# Patient Record
Sex: Male | Born: 1935 | Race: White | Hispanic: No | Marital: Married | Smoking: Never smoker
Health system: Southern US, Community
[De-identification: ages and names within clinical notes are randomized; demographics above are authoritative.]

## PROBLEM LIST (undated history)

## (undated) DIAGNOSIS — N400 Enlarged prostate without lower urinary tract symptoms: Secondary | ICD-10-CM

## (undated) DIAGNOSIS — K254 Chronic or unspecified gastric ulcer with hemorrhage: Secondary | ICD-10-CM

## (undated) HISTORY — PX: APPENDECTOMY: SHX54

---

## 1998-07-14 ENCOUNTER — Ambulatory Visit (HOSPITAL_COMMUNITY): Admission: RE | Admit: 1998-07-14 | Discharge: 1998-07-14 | Payer: Self-pay | Admitting: Gastroenterology

## 2000-05-30 ENCOUNTER — Encounter (INDEPENDENT_AMBULATORY_CARE_PROVIDER_SITE_OTHER): Payer: Self-pay | Admitting: Specialist

## 2000-05-30 ENCOUNTER — Ambulatory Visit (HOSPITAL_COMMUNITY): Admission: RE | Admit: 2000-05-30 | Discharge: 2000-05-30 | Payer: Self-pay | Admitting: Gastroenterology

## 2002-06-01 ENCOUNTER — Ambulatory Visit (HOSPITAL_COMMUNITY): Admission: RE | Admit: 2002-06-01 | Discharge: 2002-06-01 | Payer: Self-pay | Admitting: Gastroenterology

## 2002-06-01 ENCOUNTER — Encounter (INDEPENDENT_AMBULATORY_CARE_PROVIDER_SITE_OTHER): Payer: Self-pay

## 2004-07-16 ENCOUNTER — Ambulatory Visit (HOSPITAL_COMMUNITY): Admission: RE | Admit: 2004-07-16 | Discharge: 2004-07-16 | Payer: Self-pay | Admitting: Gastroenterology

## 2004-07-16 ENCOUNTER — Encounter (INDEPENDENT_AMBULATORY_CARE_PROVIDER_SITE_OTHER): Payer: Self-pay | Admitting: *Deleted

## 2009-01-18 ENCOUNTER — Encounter: Admission: RE | Admit: 2009-01-18 | Discharge: 2009-01-18 | Payer: Self-pay | Admitting: Neurology

## 2010-06-06 ENCOUNTER — Ambulatory Visit (HOSPITAL_COMMUNITY): Admission: AD | Admit: 2010-06-06 | Discharge: 2010-06-06 | Payer: Self-pay | Admitting: Ophthalmology

## 2010-10-09 LAB — BASIC METABOLIC PANEL
CO2: 23 mEq/L (ref 19–32)
Calcium: 9.7 mg/dL (ref 8.4–10.5)
Chloride: 111 mEq/L (ref 96–112)
GFR calc Af Amer: >60 mL/min (ref >60)
Potassium: 4.2 mEq/L (ref 3.5–5.1)
Sodium: 140 mEq/L (ref 135–145)

## 2010-10-09 LAB — CBC
Hemoglobin: 15.3 g/dL (ref 13.0–17.0)
MCH: 32.1 pg (ref 26.0–34.0)
MCV: 92.6 fL (ref 78.0–100.0)
RBC: 4.76 MIL/uL (ref 4.22–5.81)

## 2010-10-09 LAB — SURGICAL PCR SCREEN: Staphylococcus aureus: NEGATIVE

## 2010-12-14 NOTE — Procedures (Signed)
[facility]  Patient:    Jeffery Alexander, Jeffery Alexander                        MRN: [id number] Proc. Date: 05/30/00 Adm. Date:  [id number] Attending:  Nelda Marseille                           Procedure Report  PROCEDURE:  Esophagogastroduodenoscopy with biopsy.  INDICATION:  Barretts screening.  Consent was signed after risks, benefits, methods, and options thoroughly discussed multiple times in the past and before any premedications given today.  Additional medicines for this procedure since it followed colonoscopy was 2 mg of Versed.  DESCRIPTION OF PROCEDURE:  The video endoscope was inserted by direct vision. The proximal esophagus was normal.  In the midesophagus was an obvious area of Barretts, which was extensively biopsied at the end of the procedure.  The scope was inserted into a moderate to large hiatal hernia pouch and advanced into the stomach into the antrum, where his old antral scar was seen unchanged.  The scope was inserted through a normal pylorus into a normal duodenal bulb and around the C-loop to a normal second portion of the duodenum.  The scope was withdrawn back to the bulb, and a good look there rule out any ulcer or _____.  The scope was withdrawn back to the stomach and retroflexed.  Angularis, cardia, fundus, lesser and greater curve were evaluated on retroflex and then straight visualization.  The hiatal hernia was confirmed in the cardia, and there was some gastritis seen in the antrum and distal stomach but no other abnormalities.  The scope was then slowly withdrawn back to 20 cm.  No additional findings were seen.  Biopsies of the Barretts area were obtained at this juncture.  Air was suctioned and the scope removed.  The patient tolerated the procedure well.  There was no obvious immediate complications.  ENDOSCOPIC DIAGNOSES: 1. Moderate to large hiatal hernia. 2. Obvious Barretts status post biopsy. 3. Mild gastritis. 4. Old  antral scar, unchanged. 5. Otherwise within normal limits EGD.  PLAN;  Await pathology but probably recheck in two years unless dysplasia. Will stop alternating Prilosec and Zantac and just go to Prilosec, since he has noticed a difference recently.  Call me p.r.n. and otherwise follow up in six months. DD:  05/30/00 TD:  05/30/00 Job: [id number]

## 2010-12-14 NOTE — Op Note (Signed)
NAME:  Jeffery Alexander, Jeffery Alexander               ACCOUNT NO.:  [id number]   MEDICAL RECORD NO.:  [id number]          PATIENT TYPE:  AMB   LOCATION:  ENDO                         FACILITY:  [facility]   PHYSICIAN:  Petra Kuba, M.D.    DATE OF BIRTH:  1927/05/12   DATE OF PROCEDURE:  07/16/2004  DATE OF DISCHARGE:                                 OPERATIVE REPORT   PROCEDURE PERFORMED:  Esophagogastroduodenoscopy with biopsy.   ENDOSCOPIST:  Petra Kuba, M.D.   INDICATIONS FOR PROCEDURE:  Patient with history of Barretts due for repeat  screening.  Consent was signed prior to any premeds given, after the risks,  benefits, methods and options were thoroughly discussed multiple times in  the past.   MEDICINES USED:  Demerol 60 mg, Versed 6 mg.   DESCRIPTION OF PROCEDURE:  The video endoscope was inserted by direct  vision.  Proximally the esophagus was normal.  In the distal esophagus was a  small hiatal hernia with the obvious changes of Barretts short segment only  unchanged.  No mass lesions were seen.  Biopsies were obtained in the  customary fashion at the end of the procedure.  Scope passed into the  stomach and advanced to the antrum.  The antral scar previously seen was  seen and unchanged.  The pylorus was normal.  The scope passed into a normal  duodenal bulb and around the C-loop to a normal second portion of the  duodenum.  The scope was withdrawn back to the bulb, which was normal.  The  scope was withdrawn back to the stomach and retroflexed.  The angularis,  cardia and fundus were normal except for the hiatal hernia being confirmed  in the cardia.  There was some minimal gastritis seen on retroflexion and  straight visualization but no additional findings were seen on good  evaluation of the lesser and greater curve.  The scope was straightened and  withdrawn back to about 20 cm which confirmed the above mentioned esophageal  findings.  No other abnormalities were seen.  The biopsies  for Barretts were  obtained at this juncture.  Air was suctioned, scope was removed.  The  patient tolerated the procedure well.  There were no obvious immediate  complication.   ENDOSCOPIC DIAGNOSIS:  1.  Small hiatal hernia.  2.  Short segment Barretts unchanged, status post biopsy.  3.  Minimal gastritis.  4.  Antral ulcer scar unchanged.  5.  Otherwise normal esophagogastroduodenoscopy.   PLAN:  Await pathology, continue pump inhibitors, follow-up with me as  needed or when due for colonic screening or repeat Barretts screening.     MEM/MEDQ  D:  07/16/2004  T:  07/17/2004  Job:  [id number]

## 2010-12-14 NOTE — Procedures (Signed)
[facility]  Patient:    Jeffery Alexander, Jeffery Alexander                        MRN: [id number] Proc. Date: 05/30/00 Adm. Date:  [id number] Attending:  Nelda Marseille                           Procedure Report  PROCEDURE:  Colonoscopy.  SURGEON:  Petra Kuba, M.D.  INDICATIONS:  History of colon polyps and due for repeat screening.  Consent was signed after risks, benefits, methods, and options were thoroughly discussed multiple times in the past.  MEDICINES USED:  Demerol 80 and Versed 7.5.  DESCRIPTION OF PROCEDURE:  Rectal inspection was pertinent for small external hemorrhoids.  Digital exam is negative.  The scope was inserted fairly easily and advanced around the colon to the cecum.  This did require some abdominal pressure and no position changes.  On insertion, no abnormalities were seen. The cecum was identified by the appendiceal orifice and the ileocecal valve. The scope was slowly withdrawn.  The prep was adequate.  There was some liquid stool that required washing and suctioning.  On slow withdrawal through the colon, the cecum, ascending, and transverse were normal.  In the mid descending, a questionable tiny 1-2 mm polyp was seen and was cold biopsied x 1.  The scope was further withdrawn.  No other abnormalities were seen as we withdrew back to the rectum.  Once back in the rectum, prep-induced ______ were seen and photo documentation was obtained.  They were also seen on his last colonoscopy, not biopsied this time.  The scope was retroflexed and pertinent for some internal hemorrhoids.  The scope was straightened and readvanced a short ways up the sigmoid.  Air was suctioned and the scope removed.  The patient tolerated the procedure well and there was no obvious or immediate complication.  ENDOSCOPIC DIAGNOSES: 1. Internal and external hemorrhoids. 2. Tiny rectal erosions, probably prep-induced. 3. Questionable descending polyp, status  post cold biopsy. 4. Otherwise within normal limits to the cecum.  PLAN:  Continue workup with EGD for Barretts screening.  Await pathology, but probably would recheck colon screening in five years. DD:  05/30/00 TD:  05/30/00 Job: [id number]

## 2010-12-14 NOTE — Op Note (Signed)
   NAME:  Jeffery Alexander, Jeffery Alexander                           ACCOUNT NO.:  [id number]   MEDICAL RECORD NO.:  [id number]                   PATIENT TYPE:  AMB   LOCATION:  ENDO                                 FACILITY:  [facility]   PHYSICIAN:  Petra Kuba, M.D.                 DATE OF BIRTH:  01/06/1927   DATE OF PROCEDURE:  DATE OF DISCHARGE:                                 OPERATIVE REPORT   PROCEDURE:  Esophagogastroduodenoscopy with biopsy.   INDICATIONS FOR PROCEDURE:  Barrett's due for repeat screening. Consent was  signed after risks, benefits, methods, and options were thoroughly discussed  multiple times in the past.   MEDICINES USED:  Demerol 60, Versed 7.   DESCRIPTION OF PROCEDURE:  The video endoscope was inserted by direct  vision. The proximal mid esophagus was normal. In the distal esophagus was  the customary short segment Barrett's that looked unchanged without any mass  lesion. There was a moderate hiatal hernia. The scope passed into the  stomach and into the antrum where his old anterior scar was seen and  unchanged. There was a moderate amount of antritis. The scope was inserted  through a widely patent pylorus into a normal duodenal bulb and around the C  loop to a normal second portion of the duodenum. The scope was withdrawn  back the bulb and a good look there ruled out abnormalities in that  location. The scope was withdrawn back to the stomach and retroflexed. The  angularis, cardia, fundus, lesser and greater curve were normal except for  the hiatal hernia being confirmed in the cardia. Straight visualization in  the stomach confirmed the above finding. We went ahead and slowly withdrew  back to 20 cm. No additional esophageal findings were seen. We then first  obtained biopsies of the Barrett's and put that in container #1. We then  went ahead and took a few biopsies of the antritis and put that in container  #2. Air was suctioned, scope slowly withdrawn. Again on  slow withdrawal  through the esophagus, no additional findings were seen. The scope was  removed, the patient tolerated the procedure well. There was no obvious or  immediate complication.   ENDOSCOPIC DIAGNOSIS:  1. Moderate hiatal hernia.  2. Short segment Barrett's status post biopsy.  3. Old antral scar unchanged.  4. Antritis moderate status post biopsy.  5. Otherwise normal EGD.   PLAN:  Await pathology, continue Prilosec. Follow-up p.r.n.                                               Petra Kuba, M.D.    MEM/MEDQ  D:  06/01/2002  T:  06/01/2002  Job:  [id number]   cc:   Valetta Fuller, M.D.

## 2011-08-09 DIAGNOSIS — H11439 Conjunctival hyperemia, unspecified eye: Secondary | ICD-10-CM | POA: Diagnosis not present

## 2011-08-09 DIAGNOSIS — H4011X0 Primary open-angle glaucoma, stage unspecified: Secondary | ICD-10-CM | POA: Diagnosis not present

## 2011-08-09 DIAGNOSIS — H04129 Dry eye syndrome of unspecified lacrimal gland: Secondary | ICD-10-CM | POA: Diagnosis not present

## 2011-08-09 DIAGNOSIS — H16229 Keratoconjunctivitis sicca, not specified as Sjogren's, unspecified eye: Secondary | ICD-10-CM | POA: Diagnosis not present

## 2011-08-15 DIAGNOSIS — N401 Enlarged prostate with lower urinary tract symptoms: Secondary | ICD-10-CM | POA: Diagnosis not present

## 2011-10-23 DIAGNOSIS — H4011X0 Primary open-angle glaucoma, stage unspecified: Secondary | ICD-10-CM | POA: Diagnosis not present

## 2011-10-23 DIAGNOSIS — Z961 Presence of intraocular lens: Secondary | ICD-10-CM | POA: Diagnosis not present

## 2011-10-23 DIAGNOSIS — H409 Unspecified glaucoma: Secondary | ICD-10-CM | POA: Diagnosis not present

## 2011-10-23 DIAGNOSIS — H35359 Cystoid macular degeneration, unspecified eye: Secondary | ICD-10-CM | POA: Diagnosis not present

## 2011-10-30 DIAGNOSIS — H59029 Cataract (lens) fragments in eye following cataract surgery, unspecified eye: Secondary | ICD-10-CM | POA: Diagnosis not present

## 2011-10-30 DIAGNOSIS — H3560 Retinal hemorrhage, unspecified eye: Secondary | ICD-10-CM | POA: Diagnosis not present

## 2011-10-30 DIAGNOSIS — H35359 Cystoid macular degeneration, unspecified eye: Secondary | ICD-10-CM | POA: Diagnosis not present

## 2011-10-30 DIAGNOSIS — T8529XA Other mechanical complication of intraocular lens, initial encounter: Secondary | ICD-10-CM | POA: Diagnosis not present

## 2011-10-30 DIAGNOSIS — H35049 Retinal micro-aneurysms, unspecified, unspecified eye: Secondary | ICD-10-CM | POA: Diagnosis not present

## 2011-11-06 DIAGNOSIS — H35359 Cystoid macular degeneration, unspecified eye: Secondary | ICD-10-CM | POA: Diagnosis not present

## 2011-12-18 DIAGNOSIS — H35359 Cystoid macular degeneration, unspecified eye: Secondary | ICD-10-CM | POA: Diagnosis not present

## 2012-01-20 DIAGNOSIS — H35369 Drusen (degenerative) of macula, unspecified eye: Secondary | ICD-10-CM | POA: Diagnosis not present

## 2012-01-20 DIAGNOSIS — H4011X0 Primary open-angle glaucoma, stage unspecified: Secondary | ICD-10-CM | POA: Diagnosis not present

## 2012-01-20 DIAGNOSIS — H35359 Cystoid macular degeneration, unspecified eye: Secondary | ICD-10-CM | POA: Diagnosis not present

## 2012-01-20 DIAGNOSIS — H409 Unspecified glaucoma: Secondary | ICD-10-CM | POA: Diagnosis not present

## 2012-01-28 DIAGNOSIS — H35359 Cystoid macular degeneration, unspecified eye: Secondary | ICD-10-CM | POA: Diagnosis not present

## 2012-02-24 DIAGNOSIS — H40019 Open angle with borderline findings, low risk, unspecified eye: Secondary | ICD-10-CM | POA: Diagnosis not present

## 2012-02-24 DIAGNOSIS — H04129 Dry eye syndrome of unspecified lacrimal gland: Secondary | ICD-10-CM | POA: Diagnosis not present

## 2012-02-24 DIAGNOSIS — H4011X0 Primary open-angle glaucoma, stage unspecified: Secondary | ICD-10-CM | POA: Diagnosis not present

## 2012-03-27 DIAGNOSIS — H35049 Retinal micro-aneurysms, unspecified, unspecified eye: Secondary | ICD-10-CM | POA: Diagnosis not present

## 2012-03-27 DIAGNOSIS — H59029 Cataract (lens) fragments in eye following cataract surgery, unspecified eye: Secondary | ICD-10-CM | POA: Diagnosis not present

## 2012-03-27 DIAGNOSIS — H35359 Cystoid macular degeneration, unspecified eye: Secondary | ICD-10-CM | POA: Diagnosis not present

## 2012-05-07 DIAGNOSIS — Z23 Encounter for immunization: Secondary | ICD-10-CM | POA: Diagnosis not present

## 2012-06-01 DIAGNOSIS — Z888 Allergy status to other drugs, medicaments and biological substances status: Secondary | ICD-10-CM | POA: Diagnosis not present

## 2012-06-01 DIAGNOSIS — H409 Unspecified glaucoma: Secondary | ICD-10-CM | POA: Diagnosis not present

## 2012-06-01 DIAGNOSIS — H4011X0 Primary open-angle glaucoma, stage unspecified: Secondary | ICD-10-CM | POA: Diagnosis not present

## 2012-07-08 DIAGNOSIS — H4011X0 Primary open-angle glaucoma, stage unspecified: Secondary | ICD-10-CM | POA: Diagnosis not present

## 2012-07-08 DIAGNOSIS — H409 Unspecified glaucoma: Secondary | ICD-10-CM | POA: Diagnosis not present

## 2012-08-12 DIAGNOSIS — H409 Unspecified glaucoma: Secondary | ICD-10-CM | POA: Diagnosis not present

## 2012-08-12 DIAGNOSIS — H04129 Dry eye syndrome of unspecified lacrimal gland: Secondary | ICD-10-CM | POA: Diagnosis not present

## 2012-08-12 DIAGNOSIS — H4011X0 Primary open-angle glaucoma, stage unspecified: Secondary | ICD-10-CM | POA: Diagnosis not present

## 2012-08-26 DIAGNOSIS — H40019 Open angle with borderline findings, low risk, unspecified eye: Secondary | ICD-10-CM | POA: Diagnosis not present

## 2012-08-26 DIAGNOSIS — H04129 Dry eye syndrome of unspecified lacrimal gland: Secondary | ICD-10-CM | POA: Diagnosis not present

## 2012-08-26 DIAGNOSIS — H409 Unspecified glaucoma: Secondary | ICD-10-CM | POA: Diagnosis not present

## 2012-09-03 DIAGNOSIS — H35049 Retinal micro-aneurysms, unspecified, unspecified eye: Secondary | ICD-10-CM | POA: Diagnosis not present

## 2012-09-03 DIAGNOSIS — H59029 Cataract (lens) fragments in eye following cataract surgery, unspecified eye: Secondary | ICD-10-CM | POA: Diagnosis not present

## 2012-09-03 DIAGNOSIS — H35359 Cystoid macular degeneration, unspecified eye: Secondary | ICD-10-CM | POA: Diagnosis not present

## 2012-09-03 DIAGNOSIS — T8529XA Other mechanical complication of intraocular lens, initial encounter: Secondary | ICD-10-CM | POA: Diagnosis not present

## 2012-09-16 DIAGNOSIS — H4011X0 Primary open-angle glaucoma, stage unspecified: Secondary | ICD-10-CM | POA: Diagnosis not present

## 2012-09-16 DIAGNOSIS — H409 Unspecified glaucoma: Secondary | ICD-10-CM | POA: Diagnosis not present

## 2012-09-16 DIAGNOSIS — H40019 Open angle with borderline findings, low risk, unspecified eye: Secondary | ICD-10-CM | POA: Diagnosis not present

## 2012-09-16 DIAGNOSIS — H04129 Dry eye syndrome of unspecified lacrimal gland: Secondary | ICD-10-CM | POA: Diagnosis not present

## 2012-10-19 DIAGNOSIS — H4011X0 Primary open-angle glaucoma, stage unspecified: Secondary | ICD-10-CM | POA: Diagnosis not present

## 2012-10-19 DIAGNOSIS — H40019 Open angle with borderline findings, low risk, unspecified eye: Secondary | ICD-10-CM | POA: Diagnosis not present

## 2012-10-19 DIAGNOSIS — H04129 Dry eye syndrome of unspecified lacrimal gland: Secondary | ICD-10-CM | POA: Diagnosis not present

## 2012-10-19 DIAGNOSIS — H409 Unspecified glaucoma: Secondary | ICD-10-CM | POA: Diagnosis not present

## 2013-01-21 DIAGNOSIS — H409 Unspecified glaucoma: Secondary | ICD-10-CM | POA: Diagnosis not present

## 2013-01-21 DIAGNOSIS — H21239 Degeneration of iris (pigmentary), unspecified eye: Secondary | ICD-10-CM | POA: Diagnosis not present

## 2013-01-21 DIAGNOSIS — H4011X0 Primary open-angle glaucoma, stage unspecified: Secondary | ICD-10-CM | POA: Diagnosis not present

## 2013-01-21 DIAGNOSIS — H01009 Unspecified blepharitis unspecified eye, unspecified eyelid: Secondary | ICD-10-CM | POA: Diagnosis not present

## 2013-01-21 DIAGNOSIS — H04129 Dry eye syndrome of unspecified lacrimal gland: Secondary | ICD-10-CM | POA: Diagnosis not present

## 2013-01-21 DIAGNOSIS — H35369 Drusen (degenerative) of macula, unspecified eye: Secondary | ICD-10-CM | POA: Diagnosis not present

## 2013-01-21 DIAGNOSIS — H02829 Cysts of unspecified eye, unspecified eyelid: Secondary | ICD-10-CM | POA: Diagnosis not present

## 2013-02-18 DIAGNOSIS — H40019 Open angle with borderline findings, low risk, unspecified eye: Secondary | ICD-10-CM | POA: Diagnosis not present

## 2013-02-18 DIAGNOSIS — H409 Unspecified glaucoma: Secondary | ICD-10-CM | POA: Diagnosis not present

## 2013-02-18 DIAGNOSIS — H4011X0 Primary open-angle glaucoma, stage unspecified: Secondary | ICD-10-CM | POA: Diagnosis not present

## 2013-02-18 DIAGNOSIS — H04129 Dry eye syndrome of unspecified lacrimal gland: Secondary | ICD-10-CM | POA: Diagnosis not present

## 2013-05-02 DIAGNOSIS — Z23 Encounter for immunization: Secondary | ICD-10-CM | POA: Diagnosis not present

## 2013-06-17 DIAGNOSIS — H02839 Dermatochalasis of unspecified eye, unspecified eyelid: Secondary | ICD-10-CM | POA: Diagnosis not present

## 2013-06-17 DIAGNOSIS — H40019 Open angle with borderline findings, low risk, unspecified eye: Secondary | ICD-10-CM | POA: Diagnosis not present

## 2013-06-17 DIAGNOSIS — H409 Unspecified glaucoma: Secondary | ICD-10-CM | POA: Diagnosis not present

## 2013-06-17 DIAGNOSIS — H4011X0 Primary open-angle glaucoma, stage unspecified: Secondary | ICD-10-CM | POA: Diagnosis not present

## 2013-06-17 DIAGNOSIS — H04129 Dry eye syndrome of unspecified lacrimal gland: Secondary | ICD-10-CM | POA: Diagnosis not present

## 2013-06-22 DIAGNOSIS — Z136 Encounter for screening for cardiovascular disorders: Secondary | ICD-10-CM | POA: Diagnosis not present

## 2013-06-22 DIAGNOSIS — R030 Elevated blood-pressure reading, without diagnosis of hypertension: Secondary | ICD-10-CM | POA: Diagnosis not present

## 2013-08-19 DIAGNOSIS — R972 Elevated prostate specific antigen [PSA]: Secondary | ICD-10-CM | POA: Diagnosis not present

## 2013-08-19 DIAGNOSIS — N139 Obstructive and reflux uropathy, unspecified: Secondary | ICD-10-CM | POA: Diagnosis not present

## 2013-08-19 DIAGNOSIS — N138 Other obstructive and reflux uropathy: Secondary | ICD-10-CM | POA: Diagnosis not present

## 2013-08-19 DIAGNOSIS — N401 Enlarged prostate with lower urinary tract symptoms: Secondary | ICD-10-CM | POA: Diagnosis not present

## 2013-08-24 DIAGNOSIS — Z23 Encounter for immunization: Secondary | ICD-10-CM | POA: Diagnosis not present

## 2013-08-24 DIAGNOSIS — R030 Elevated blood-pressure reading, without diagnosis of hypertension: Secondary | ICD-10-CM | POA: Diagnosis not present

## 2013-09-02 DIAGNOSIS — H35359 Cystoid macular degeneration, unspecified eye: Secondary | ICD-10-CM | POA: Diagnosis not present

## 2013-09-02 DIAGNOSIS — H35049 Retinal micro-aneurysms, unspecified, unspecified eye: Secondary | ICD-10-CM | POA: Diagnosis not present

## 2013-10-21 DIAGNOSIS — H409 Unspecified glaucoma: Secondary | ICD-10-CM | POA: Diagnosis not present

## 2013-10-21 DIAGNOSIS — Z961 Presence of intraocular lens: Secondary | ICD-10-CM | POA: Diagnosis not present

## 2013-10-21 DIAGNOSIS — H40019 Open angle with borderline findings, low risk, unspecified eye: Secondary | ICD-10-CM | POA: Diagnosis not present

## 2013-10-21 DIAGNOSIS — H4011X0 Primary open-angle glaucoma, stage unspecified: Secondary | ICD-10-CM | POA: Diagnosis not present

## 2014-01-13 DIAGNOSIS — H40019 Open angle with borderline findings, low risk, unspecified eye: Secondary | ICD-10-CM | POA: Diagnosis not present

## 2014-01-13 DIAGNOSIS — H409 Unspecified glaucoma: Secondary | ICD-10-CM | POA: Diagnosis not present

## 2014-01-13 DIAGNOSIS — H4011X0 Primary open-angle glaucoma, stage unspecified: Secondary | ICD-10-CM | POA: Diagnosis not present

## 2014-01-13 DIAGNOSIS — H524 Presbyopia: Secondary | ICD-10-CM | POA: Diagnosis not present

## 2014-01-13 DIAGNOSIS — H40059 Ocular hypertension, unspecified eye: Secondary | ICD-10-CM | POA: Diagnosis not present

## 2014-02-21 DIAGNOSIS — N400 Enlarged prostate without lower urinary tract symptoms: Secondary | ICD-10-CM | POA: Diagnosis not present

## 2014-02-21 DIAGNOSIS — H409 Unspecified glaucoma: Secondary | ICD-10-CM | POA: Diagnosis not present

## 2014-02-21 DIAGNOSIS — Z8719 Personal history of other diseases of the digestive system: Secondary | ICD-10-CM | POA: Diagnosis not present

## 2014-02-21 DIAGNOSIS — R030 Elevated blood-pressure reading, without diagnosis of hypertension: Secondary | ICD-10-CM | POA: Diagnosis not present

## 2014-05-09 DIAGNOSIS — Z23 Encounter for immunization: Secondary | ICD-10-CM | POA: Diagnosis not present

## 2014-05-26 DIAGNOSIS — H4011X2 Primary open-angle glaucoma, moderate stage: Secondary | ICD-10-CM | POA: Diagnosis not present

## 2014-05-26 DIAGNOSIS — H40052 Ocular hypertension, left eye: Secondary | ICD-10-CM | POA: Diagnosis not present

## 2014-05-31 DIAGNOSIS — H4011X2 Primary open-angle glaucoma, moderate stage: Secondary | ICD-10-CM | POA: Diagnosis not present

## 2014-06-29 DIAGNOSIS — H40011 Open angle with borderline findings, low risk, right eye: Secondary | ICD-10-CM | POA: Diagnosis not present

## 2014-06-29 DIAGNOSIS — H4011X2 Primary open-angle glaucoma, moderate stage: Secondary | ICD-10-CM | POA: Diagnosis not present

## 2014-07-20 DIAGNOSIS — H4011X2 Primary open-angle glaucoma, moderate stage: Secondary | ICD-10-CM | POA: Diagnosis not present

## 2014-07-20 DIAGNOSIS — H40011 Open angle with borderline findings, low risk, right eye: Secondary | ICD-10-CM | POA: Diagnosis not present

## 2014-08-22 DIAGNOSIS — R351 Nocturia: Secondary | ICD-10-CM | POA: Diagnosis not present

## 2014-08-22 DIAGNOSIS — N401 Enlarged prostate with lower urinary tract symptoms: Secondary | ICD-10-CM | POA: Diagnosis not present

## 2014-08-22 DIAGNOSIS — R972 Elevated prostate specific antigen [PSA]: Secondary | ICD-10-CM | POA: Diagnosis not present

## 2014-09-28 DIAGNOSIS — H04123 Dry eye syndrome of bilateral lacrimal glands: Secondary | ICD-10-CM | POA: Diagnosis not present

## 2014-09-28 DIAGNOSIS — H524 Presbyopia: Secondary | ICD-10-CM | POA: Diagnosis not present

## 2014-09-28 DIAGNOSIS — H52223 Regular astigmatism, bilateral: Secondary | ICD-10-CM | POA: Diagnosis not present

## 2014-09-28 DIAGNOSIS — H5202 Hypermetropia, left eye: Secondary | ICD-10-CM | POA: Diagnosis not present

## 2014-09-28 DIAGNOSIS — Z961 Presence of intraocular lens: Secondary | ICD-10-CM | POA: Diagnosis not present

## 2014-10-06 DIAGNOSIS — H40011 Open angle with borderline findings, low risk, right eye: Secondary | ICD-10-CM | POA: Diagnosis not present

## 2014-10-06 DIAGNOSIS — H4011X2 Primary open-angle glaucoma, moderate stage: Secondary | ICD-10-CM | POA: Diagnosis not present

## 2015-01-16 DIAGNOSIS — H4011X2 Primary open-angle glaucoma, moderate stage: Secondary | ICD-10-CM | POA: Diagnosis not present

## 2015-01-16 DIAGNOSIS — H40011 Open angle with borderline findings, low risk, right eye: Secondary | ICD-10-CM | POA: Diagnosis not present

## 2015-02-03 DIAGNOSIS — H4011X1 Primary open-angle glaucoma, mild stage: Secondary | ICD-10-CM | POA: Diagnosis not present

## 2015-02-22 DIAGNOSIS — Z8719 Personal history of other diseases of the digestive system: Secondary | ICD-10-CM | POA: Diagnosis not present

## 2015-02-22 DIAGNOSIS — Z1389 Encounter for screening for other disorder: Secondary | ICD-10-CM | POA: Diagnosis not present

## 2015-02-22 DIAGNOSIS — H409 Unspecified glaucoma: Secondary | ICD-10-CM | POA: Diagnosis not present

## 2015-02-22 DIAGNOSIS — R030 Elevated blood-pressure reading, without diagnosis of hypertension: Secondary | ICD-10-CM | POA: Diagnosis not present

## 2015-02-22 DIAGNOSIS — N400 Enlarged prostate without lower urinary tract symptoms: Secondary | ICD-10-CM | POA: Diagnosis not present

## 2015-05-08 DIAGNOSIS — Z23 Encounter for immunization: Secondary | ICD-10-CM | POA: Diagnosis not present

## 2015-08-25 DIAGNOSIS — N401 Enlarged prostate with lower urinary tract symptoms: Secondary | ICD-10-CM | POA: Diagnosis not present

## 2015-08-25 DIAGNOSIS — N138 Other obstructive and reflux uropathy: Secondary | ICD-10-CM | POA: Diagnosis not present

## 2015-08-25 DIAGNOSIS — Z0000 Encounter for general adult medical examination without abnormal findings: Secondary | ICD-10-CM | POA: Diagnosis not present

## 2015-08-25 DIAGNOSIS — R351 Nocturia: Secondary | ICD-10-CM | POA: Diagnosis not present

## 2015-10-05 DIAGNOSIS — R6889 Other general symptoms and signs: Secondary | ICD-10-CM | POA: Diagnosis not present

## 2015-10-05 DIAGNOSIS — M791 Myalgia: Secondary | ICD-10-CM | POA: Diagnosis not present

## 2015-10-05 DIAGNOSIS — R52 Pain, unspecified: Secondary | ICD-10-CM | POA: Diagnosis not present

## 2016-02-23 DIAGNOSIS — Z0000 Encounter for general adult medical examination without abnormal findings: Secondary | ICD-10-CM | POA: Diagnosis not present

## 2016-02-23 DIAGNOSIS — Z1389 Encounter for screening for other disorder: Secondary | ICD-10-CM | POA: Diagnosis not present

## 2016-02-23 DIAGNOSIS — K5909 Other constipation: Secondary | ICD-10-CM | POA: Diagnosis not present

## 2016-02-23 DIAGNOSIS — D7589 Other specified diseases of blood and blood-forming organs: Secondary | ICD-10-CM | POA: Diagnosis not present

## 2016-02-23 DIAGNOSIS — H409 Unspecified glaucoma: Secondary | ICD-10-CM | POA: Diagnosis not present

## 2016-02-23 DIAGNOSIS — N400 Enlarged prostate without lower urinary tract symptoms: Secondary | ICD-10-CM | POA: Diagnosis not present

## 2016-02-23 DIAGNOSIS — R030 Elevated blood-pressure reading, without diagnosis of hypertension: Secondary | ICD-10-CM | POA: Diagnosis not present

## 2016-02-23 DIAGNOSIS — R739 Hyperglycemia, unspecified: Secondary | ICD-10-CM | POA: Diagnosis not present

## 2016-04-18 DIAGNOSIS — Z23 Encounter for immunization: Secondary | ICD-10-CM | POA: Diagnosis not present

## 2016-07-04 DIAGNOSIS — H4061X1 Glaucoma secondary to drugs, right eye, mild stage: Secondary | ICD-10-CM | POA: Diagnosis not present

## 2016-07-04 DIAGNOSIS — H35033 Hypertensive retinopathy, bilateral: Secondary | ICD-10-CM | POA: Diagnosis not present

## 2016-07-04 DIAGNOSIS — H4062X4 Glaucoma secondary to drugs, left eye, indeterminate stage: Secondary | ICD-10-CM | POA: Diagnosis not present

## 2016-07-04 DIAGNOSIS — H35363 Drusen (degenerative) of macula, bilateral: Secondary | ICD-10-CM | POA: Diagnosis not present

## 2016-09-02 DIAGNOSIS — N401 Enlarged prostate with lower urinary tract symptoms: Secondary | ICD-10-CM | POA: Diagnosis not present

## 2016-09-02 DIAGNOSIS — R351 Nocturia: Secondary | ICD-10-CM | POA: Diagnosis not present

## 2017-01-02 DIAGNOSIS — H4062X4 Glaucoma secondary to drugs, left eye, indeterminate stage: Secondary | ICD-10-CM | POA: Diagnosis not present

## 2017-01-02 DIAGNOSIS — H4061X1 Glaucoma secondary to drugs, right eye, mild stage: Secondary | ICD-10-CM | POA: Diagnosis not present

## 2017-01-02 DIAGNOSIS — H02052 Trichiasis without entropian right lower eyelid: Secondary | ICD-10-CM | POA: Diagnosis not present

## 2017-01-09 DIAGNOSIS — H4920 Sixth [abducent] nerve palsy, unspecified eye: Secondary | ICD-10-CM | POA: Diagnosis not present

## 2017-01-09 DIAGNOSIS — H532 Diplopia: Secondary | ICD-10-CM | POA: Diagnosis not present

## 2017-01-30 DIAGNOSIS — H4920 Sixth [abducent] nerve palsy, unspecified eye: Secondary | ICD-10-CM | POA: Diagnosis not present

## 2017-01-30 DIAGNOSIS — H532 Diplopia: Secondary | ICD-10-CM | POA: Diagnosis not present

## 2017-02-12 DIAGNOSIS — K5901 Slow transit constipation: Secondary | ICD-10-CM | POA: Diagnosis not present

## 2017-02-12 DIAGNOSIS — K2270 Barrett's esophagus without dysplasia: Secondary | ICD-10-CM | POA: Diagnosis not present

## 2017-02-12 DIAGNOSIS — R1011 Right upper quadrant pain: Secondary | ICD-10-CM | POA: Diagnosis not present

## 2017-02-12 DIAGNOSIS — R1311 Dysphagia, oral phase: Secondary | ICD-10-CM | POA: Diagnosis not present

## 2017-02-25 DIAGNOSIS — R1311 Dysphagia, oral phase: Secondary | ICD-10-CM | POA: Diagnosis not present

## 2017-02-25 DIAGNOSIS — Z0000 Encounter for general adult medical examination without abnormal findings: Secondary | ICD-10-CM | POA: Diagnosis not present

## 2017-02-25 DIAGNOSIS — N400 Enlarged prostate without lower urinary tract symptoms: Secondary | ICD-10-CM | POA: Diagnosis not present

## 2017-02-25 DIAGNOSIS — K2270 Barrett's esophagus without dysplasia: Secondary | ICD-10-CM | POA: Diagnosis not present

## 2017-02-25 DIAGNOSIS — Z1389 Encounter for screening for other disorder: Secondary | ICD-10-CM | POA: Diagnosis not present

## 2017-02-25 DIAGNOSIS — D7589 Other specified diseases of blood and blood-forming organs: Secondary | ICD-10-CM | POA: Diagnosis not present

## 2017-02-25 DIAGNOSIS — R739 Hyperglycemia, unspecified: Secondary | ICD-10-CM | POA: Diagnosis not present

## 2017-02-25 DIAGNOSIS — H409 Unspecified glaucoma: Secondary | ICD-10-CM | POA: Diagnosis not present

## 2017-02-26 DIAGNOSIS — R1310 Dysphagia, unspecified: Secondary | ICD-10-CM | POA: Diagnosis not present

## 2017-02-26 DIAGNOSIS — K449 Diaphragmatic hernia without obstruction or gangrene: Secondary | ICD-10-CM | POA: Diagnosis not present

## 2017-02-26 DIAGNOSIS — D490 Neoplasm of unspecified behavior of digestive system: Secondary | ICD-10-CM | POA: Diagnosis not present

## 2017-02-26 DIAGNOSIS — K2270 Barrett's esophagus without dysplasia: Secondary | ICD-10-CM | POA: Diagnosis not present

## 2017-02-26 DIAGNOSIS — K3189 Other diseases of stomach and duodenum: Secondary | ICD-10-CM | POA: Diagnosis not present

## 2017-02-26 DIAGNOSIS — K2980 Duodenitis without bleeding: Secondary | ICD-10-CM | POA: Diagnosis not present

## 2017-03-04 DIAGNOSIS — K2270 Barrett's esophagus without dysplasia: Secondary | ICD-10-CM | POA: Diagnosis not present

## 2017-03-06 ENCOUNTER — Other Ambulatory Visit: Payer: Self-pay | Admitting: Gastroenterology

## 2017-03-06 DIAGNOSIS — R1311 Dysphagia, oral phase: Secondary | ICD-10-CM | POA: Diagnosis not present

## 2017-03-06 DIAGNOSIS — K2270 Barrett's esophagus without dysplasia: Secondary | ICD-10-CM | POA: Diagnosis not present

## 2017-03-10 ENCOUNTER — Other Ambulatory Visit: Payer: Self-pay | Admitting: Gastroenterology

## 2017-03-12 ENCOUNTER — Encounter (HOSPITAL_COMMUNITY)
Admission: RE | Disposition: A | Discharge status: Home / Self Care | Payer: Self-pay | Source: Ambulatory Visit | Attending: Gastroenterology

## 2017-03-12 ENCOUNTER — Ambulatory Visit (HOSPITAL_COMMUNITY): Payer: Medicare Other | Admitting: Anesthesiology

## 2017-03-12 ENCOUNTER — Ambulatory Visit (HOSPITAL_COMMUNITY)
Admission: RE | Admit: 2017-03-12 | Discharge: 2017-03-12 | Disposition: A | Discharge status: Home / Self Care | Payer: Medicare Other | Source: Ambulatory Visit | Attending: Gastroenterology | Admitting: Gastroenterology

## 2017-03-12 ENCOUNTER — Encounter (HOSPITAL_COMMUNITY): Payer: Self-pay | Admitting: *Deleted

## 2017-03-12 DIAGNOSIS — K228 Other specified diseases of esophagus: Secondary | ICD-10-CM | POA: Diagnosis not present

## 2017-03-12 DIAGNOSIS — K2270 Barrett's esophagus without dysplasia: Secondary | ICD-10-CM | POA: Diagnosis not present

## 2017-03-12 DIAGNOSIS — K2210 Ulcer of esophagus without bleeding: Secondary | ICD-10-CM | POA: Insufficient documentation

## 2017-03-12 DIAGNOSIS — R1310 Dysphagia, unspecified: Secondary | ICD-10-CM | POA: Diagnosis not present

## 2017-03-12 DIAGNOSIS — K259 Gastric ulcer, unspecified as acute or chronic, without hemorrhage or perforation: Secondary | ICD-10-CM | POA: Insufficient documentation

## 2017-03-12 DIAGNOSIS — R1011 Right upper quadrant pain: Secondary | ICD-10-CM | POA: Diagnosis not present

## 2017-03-12 HISTORY — PX: EUS: SHX5427

## 2017-03-12 SURGERY — UPPER ENDOSCOPIC ULTRASOUND (EUS) RADIAL
Anesthesia: Monitor Anesthesia Care

## 2017-03-12 MED ORDER — PROPOFOL 10 MG/ML IV BOLUS
INTRAVENOUS | Status: DC | PRN
  Administered 2017-03-12 (×4): 20 mg via INTRAVENOUS
  Administered 2017-03-12: 50 mg via INTRAVENOUS
  Administered 2017-03-12 (×5): 20 mg via INTRAVENOUS

## 2017-03-12 MED ORDER — LACTATED RINGERS IV SOLN
INTRAVENOUS | Status: DC
  Administered 2017-03-12 (×2): via INTRAVENOUS

## 2017-03-12 MED ORDER — LIDOCAINE 2% (20 MG/ML) 5 ML SYRINGE
INTRAMUSCULAR | Status: AC
Start: 2017-03-12 — End: ?
  Filled 2017-03-12: qty 5

## 2017-03-12 MED ORDER — SODIUM CHLORIDE 0.9 % IV SOLN: INTRAVENOUS | Status: DC

## 2017-03-12 MED ORDER — LIDOCAINE HCL (CARDIAC) 20 MG/ML IV SOLN
INTRAVENOUS | Status: DC | PRN
  Administered 2017-03-12: 100 mg via INTRATRACHEAL

## 2017-03-12 MED ORDER — PROPOFOL 10 MG/ML IV BOLUS
INTRAVENOUS | Status: AC
  Filled 2017-03-12: qty 40

## 2017-03-12 NOTE — Discharge Instructions (Signed)

## 2017-03-12 NOTE — Anesthesia Postprocedure Evaluation (Signed)
Anesthesia Post Note  Patient: [name]  Procedure(s) Performed: Procedure(s) (LRB): UPPER ENDOSCOPIC ULTRASOUND (EUS) RADIAL (N/A)     Patient location during evaluation: Endoscopy Anesthesia Type: MAC Level of consciousness: awake and alert Pain management: pain level controlled Vital Signs Assessment: post-procedure vital signs reviewed and stable Respiratory status: spontaneous breathing, nonlabored ventilation, respiratory function stable and patient connected to nasal cannula oxygen Cardiovascular status: stable and blood pressure returned to baseline Anesthetic complications: no    Last Vitals:  Vitals:   03/12/17 0940 03/12/17 0950  BP: (!) 106/54   Pulse:    Resp: 17 13  Temp:    SpO2: 96%     Last Pain:  Vitals:   03/12/17 0933  TempSrc: Oral                 Eron Staat,JAMES TERRILL

## 2017-03-12 NOTE — Anesthesia Preprocedure Evaluation (Signed)
Anesthesia Evaluation  Patient identified by MRN, date of birth, ID band Patient awake    Reviewed: Allergy & Precautions, NPO status , Patient's Chart, lab work & pertinent test results  Airway Mallampati: II   Neck ROM: Full    Dental no notable dental hx.    Pulmonary neg pulmonary ROS,    breath sounds clear to auscultation       Cardiovascular negative cardio ROS   Rhythm:Regular Rate:Normal     Neuro/Psych negative neurological ROS     GI/Hepatic negative GI ROS, Neg liver ROS,   Endo/Other  negative endocrine ROS  Renal/GU negative Renal ROS     Musculoskeletal   Abdominal   Peds  Hematology negative hematology ROS (+)   Anesthesia Other Findings   Reproductive/Obstetrics                             Anesthesia Physical Anesthesia Plan  ASA: I  Anesthesia Plan: MAC   Post-op Pain Management:    Induction: Intravenous  PONV Risk Score and Plan: 1 and Ondansetron and Dexamethasone  Airway Management Planned: Natural Airway and Nasal Cannula  Additional Equipment:   Intra-op Plan:   Post-operative Plan:   Informed Consent: I have reviewed the patients History and Physical, chart, labs and discussed the procedure including the risks, benefits and alternatives for the proposed anesthesia with the patient or authorized representative who has indicated his/her understanding and acceptance.     Plan Discussed with: CRNA  Anesthesia Plan Comments:         Anesthesia Quick Evaluation

## 2017-03-12 NOTE — Op Note (Signed)
[facility] Patient Name: Jeffery Alexander Procedure Date: 03/12/2017 MRN: [id number] Attending MD: Arta Silence , MD Date of Birth: [birth year] CSN: [id number] Age: 83 Admit Type: Outpatient Procedure:                Upper EUS Indications:              Esophageal mucosal mass/polyp found on endoscopy,                            Suspected esophageal neoplasm, Dysphagia, Barrett's                            esophagus, Follow-up of Barrett's esophagus Providers:                Arta Silence, MD, Elmer Ramp. Tilden Dome, RN, [employer], Technician, Stephanie [place] CRNA Referring MD:              Medicines:                Monitored Anesthesia Care Complications:            No immediate complications. Estimated Blood Loss:     Estimated blood loss was minimal. Procedure:                Pre-Anesthesia Assessment:                           - Prior to the procedure, a History and Physical                            was performed, and patient medications and                            allergies were reviewed. The patient's tolerance of                            previous anesthesia was also reviewed. The risks                            and benefits of the procedure and the sedation                            options and risks were discussed with the patient.                            All questions were answered, and informed consent                            was obtained. Prior Anticoagulants: The patient has                            taken no previous anticoagulant or antiplatelet  agents. ASA Grade Assessment: I - A normal, healthy                            patient. After reviewing the risks and benefits,                            the patient was deemed in satisfactory condition to                            undergo the procedure.                           After obtaining informed consent, the endoscope was                passed under direct vision. Throughout the                            procedure, the patient's blood pressure, pulse, and                            oxygen saturations were monitored continuously. The                            was introduced through the mouth, and advanced to                            the lower third of esophagus. The upper EUS was                            accomplished without difficulty. The patient                            tolerated the procedure well. Scope In: Scope Out: Findings:      Endoscopic Finding :      There were esophageal mucosal changes secondary to established       long-segment Barrett's disease present in the middle third of the       esophagus and in the lower third of the esophagus.      One cratered esophageal ulcer with no bleeding was found at the GE       junction; with secondary luminal stenosis; region was firm and fixed to       palpation; radial echoendoscope could not pass through this area.       Biopsies were taken with a cold forceps for histology.      Endosonographic Finding :      Localized wall thickening was visualized endosonographically in the       gastroesophageal junction. This appeared to be primarily due to       thickening of the intramural wall, did not see any obvious penetration       of pathology through the muscularis propria. However, since I couldn't       pass the eus scope through this region, I really couldn't get complete       views of this area. Lots of air artifact from the trachea, but I did not       see any  obvious peritumoral adenopathy. Impression:               - Esophageal mucosal changes secondary to                            established long-segment Barrett's disease.                           - Non-bleeding esophageal ulcer. Biopsied.                           - Wall thickening was seen in the gastroesophageal                            junction. The thickening appeared to  primarily be                            within the intramural wall, but the wall layer                            could not be determined. Moderate Sedation:      None Recommendation:           - Discharge patient to home (via wheelchair).                           - Mechanical soft diet indefinitely.                           - Continue present medications.                           - Await path results.                           - Return to GI clinic after studies are complete.                           - Return to referring physician as previously                            scheduled. Procedure Code(s):        --- Professional ---                           [phone], Esophagoscopy, flexible, transoral; with                            endoscopic ultrasound examination                           27741, Esophagoscopy, flexible, transoral; with                            biopsy, single or multiple Diagnosis Code(s):        --- Professional ---  K22.70, Barrett's esophagus without dysplasia                           K22.10, Ulcer of esophagus without bleeding                           K22.8, Other specified diseases of esophagus                           R13.10, Dysphagia, unspecified CPT copyright 2016 [employer] Medical Association. All rights reserved. The codes documented in this report are preliminary and upon coder review may  be revised to meet current compliance requirements. Arta Silence, MD 03/12/2017 9:38:00 AM This report has been signed electronically. Number of Addenda: 0

## 2017-03-12 NOTE — Transfer of Care (Signed)
Immediate Anesthesia Transfer of Care Note  Patient: Jeffery Alexander  Procedure(s) Performed: Procedure(s): UPPER ENDOSCOPIC ULTRASOUND (EUS) RADIAL (N/A)  Patient Location: PACU and Endoscopy Unit  Anesthesia Type:MAC  Level of Consciousness: awake and alert   Airway & Oxygen Therapy: Patient Spontanous Breathing  Post-op Assessment: Report given to RN and Post -op Vital signs reviewed and stable  Post vital signs: Reviewed and stable  Last Vitals:  Vitals:   03/12/17 0735  BP: (!) 144/65  Pulse: 71  Resp: (!) 21  Temp: 36.5 C  SpO2: 100%    Last Pain:  Vitals:   03/12/17 0735  TempSrc: Oral         Complications: No apparent anesthesia complications

## 2017-03-12 NOTE — H&P (Signed)
Patient interval history reviewed.  Patient examined again.  There has been no change from documented H/P dated 03/06/17 (scanned into chart from our office) except as documented above.  Assessment:  1.  Barrett's esophagus, concern for possible submucosal process.  Plan:  1.  Endoscopic ultrasound with possible fine needle aspiration. 2.  Risks (bleeding, infection, bowel perforation that could require surgery, sedation-related changes in cardiopulmonary systems), benefits (identification and possible treatment of source of symptoms, exclusion of certain causes of symptoms), and alternatives (watchful waiting, radiographic imaging studies, empiric medical treatment) of upper endoscopy with ultrasound and possible mucosal or fine needle aspiration biopsies (EUS +/- FNA) were explained to patient/family in detail and patient wishes to proceed.

## 2017-03-13 ENCOUNTER — Encounter (HOSPITAL_COMMUNITY): Payer: Self-pay | Admitting: Gastroenterology

## 2017-04-24 DIAGNOSIS — K2270 Barrett's esophagus without dysplasia: Secondary | ICD-10-CM | POA: Diagnosis not present

## 2017-04-24 DIAGNOSIS — R1311 Dysphagia, oral phase: Secondary | ICD-10-CM | POA: Diagnosis not present

## 2017-05-05 DIAGNOSIS — Z23 Encounter for immunization: Secondary | ICD-10-CM | POA: Diagnosis not present

## 2017-05-23 DIAGNOSIS — R1311 Dysphagia, oral phase: Secondary | ICD-10-CM | POA: Diagnosis not present

## 2017-05-23 DIAGNOSIS — K2270 Barrett's esophagus without dysplasia: Secondary | ICD-10-CM | POA: Diagnosis not present

## 2017-07-24 DIAGNOSIS — K3189 Other diseases of stomach and duodenum: Secondary | ICD-10-CM | POA: Diagnosis not present

## 2017-07-24 DIAGNOSIS — K2980 Duodenitis without bleeding: Secondary | ICD-10-CM | POA: Diagnosis not present

## 2017-07-24 DIAGNOSIS — K449 Diaphragmatic hernia without obstruction or gangrene: Secondary | ICD-10-CM | POA: Diagnosis not present

## 2017-07-24 DIAGNOSIS — K2270 Barrett's esophagus without dysplasia: Secondary | ICD-10-CM | POA: Diagnosis not present

## 2017-07-24 DIAGNOSIS — K228 Other specified diseases of esophagus: Secondary | ICD-10-CM | POA: Diagnosis not present

## 2017-07-24 DIAGNOSIS — K319 Disease of stomach and duodenum, unspecified: Secondary | ICD-10-CM | POA: Diagnosis not present

## 2017-07-31 DIAGNOSIS — K2270 Barrett's esophagus without dysplasia: Secondary | ICD-10-CM | POA: Diagnosis not present

## 2017-07-31 DIAGNOSIS — K319 Disease of stomach and duodenum, unspecified: Secondary | ICD-10-CM | POA: Diagnosis not present

## 2017-08-12 DIAGNOSIS — N401 Enlarged prostate with lower urinary tract symptoms: Secondary | ICD-10-CM | POA: Diagnosis not present

## 2017-08-12 DIAGNOSIS — Z125 Encounter for screening for malignant neoplasm of prostate: Secondary | ICD-10-CM | POA: Diagnosis not present

## 2017-08-12 DIAGNOSIS — R351 Nocturia: Secondary | ICD-10-CM | POA: Diagnosis not present

## 2017-08-25 DIAGNOSIS — H35033 Hypertensive retinopathy, bilateral: Secondary | ICD-10-CM | POA: Diagnosis not present

## 2017-08-25 DIAGNOSIS — Z961 Presence of intraocular lens: Secondary | ICD-10-CM | POA: Diagnosis not present

## 2017-08-25 DIAGNOSIS — H4061X1 Glaucoma secondary to drugs, right eye, mild stage: Secondary | ICD-10-CM | POA: Diagnosis not present

## 2017-08-25 DIAGNOSIS — H4062X4 Glaucoma secondary to drugs, left eye, indeterminate stage: Secondary | ICD-10-CM | POA: Diagnosis not present

## 2017-10-21 DIAGNOSIS — R1311 Dysphagia, oral phase: Secondary | ICD-10-CM | POA: Diagnosis not present

## 2018-02-23 DIAGNOSIS — H401131 Primary open-angle glaucoma, bilateral, mild stage: Secondary | ICD-10-CM | POA: Diagnosis not present

## 2018-03-03 DIAGNOSIS — N400 Enlarged prostate without lower urinary tract symptoms: Secondary | ICD-10-CM | POA: Diagnosis not present

## 2018-03-03 DIAGNOSIS — Z1389 Encounter for screening for other disorder: Secondary | ICD-10-CM | POA: Diagnosis not present

## 2018-03-03 DIAGNOSIS — R1013 Epigastric pain: Secondary | ICD-10-CM | POA: Diagnosis not present

## 2018-03-03 DIAGNOSIS — Z0000 Encounter for general adult medical examination without abnormal findings: Secondary | ICD-10-CM | POA: Diagnosis not present

## 2018-03-03 DIAGNOSIS — H409 Unspecified glaucoma: Secondary | ICD-10-CM | POA: Diagnosis not present

## 2018-04-23 DIAGNOSIS — Z23 Encounter for immunization: Secondary | ICD-10-CM | POA: Diagnosis not present

## 2018-06-03 DIAGNOSIS — H532 Diplopia: Secondary | ICD-10-CM | POA: Diagnosis not present

## 2018-07-01 DIAGNOSIS — H532 Diplopia: Secondary | ICD-10-CM | POA: Diagnosis not present

## 2018-07-17 DIAGNOSIS — N472 Paraphimosis: Secondary | ICD-10-CM | POA: Diagnosis not present

## 2018-07-17 DIAGNOSIS — R3121 Asymptomatic microscopic hematuria: Secondary | ICD-10-CM | POA: Diagnosis not present

## 2018-07-17 DIAGNOSIS — N481 Balanitis: Secondary | ICD-10-CM | POA: Diagnosis not present

## 2018-08-12 DIAGNOSIS — H532 Diplopia: Secondary | ICD-10-CM | POA: Diagnosis not present

## 2018-08-13 DIAGNOSIS — N401 Enlarged prostate with lower urinary tract symptoms: Secondary | ICD-10-CM | POA: Diagnosis not present

## 2018-08-13 DIAGNOSIS — R351 Nocturia: Secondary | ICD-10-CM | POA: Diagnosis not present

## 2018-08-13 DIAGNOSIS — Z125 Encounter for screening for malignant neoplasm of prostate: Secondary | ICD-10-CM | POA: Diagnosis not present

## 2018-09-15 ENCOUNTER — Encounter (HOSPITAL_COMMUNITY): Payer: Self-pay

## 2018-09-15 DIAGNOSIS — R42 Dizziness and giddiness: Secondary | ICD-10-CM | POA: Diagnosis not present

## 2018-09-15 DIAGNOSIS — Z79899 Other long term (current) drug therapy: Secondary | ICD-10-CM | POA: Diagnosis not present

## 2018-09-15 LAB — BASIC METABOLIC PANEL
Anion gap: 10 (ref 5–15)
BUN: 12 mg/dL (ref 8–23)
CHLORIDE: 106 mmol/L (ref 98–111)
CO2: 27 mmol/L (ref 22–32)
Calcium: 8.9 mg/dL (ref 8.9–10.3)
Creatinine, Ser: 0.94 mg/dL (ref 0.61–1.24)
GFR calc Af Amer: >60 mL/min (ref >60)
GFR calc non Af Amer: >60 mL/min (ref >60)
GLUCOSE: 112 mg/dL — AB (ref 70–99)
POTASSIUM: 3.3 mmol/L — AB (ref 3.5–5.1)
Sodium: 143 mmol/L (ref 135–145)

## 2018-09-15 LAB — CBC
HEMATOCRIT: 39.6 % (ref 39.0–52.0)
Hemoglobin: 13.1 g/dL (ref 13.0–17.0)
MCH: 32.6 pg (ref 26.0–34.0)
MCHC: 33.1 g/dL (ref 30.0–36.0)
MCV: 98.5 fL (ref 80.0–100.0)
Platelets: 200 10*3/uL (ref 150–400)
RBC: 4.02 MIL/uL — AB (ref 4.22–5.81)
RDW: 12.7 % (ref 11.5–15.5)
WBC: 5.9 10*3/uL (ref 4.0–10.5)
nRBC: 0 % (ref 0.0–0.2)

## 2018-09-15 NOTE — ED Triage Notes (Signed)
Pt stating over the last month when he lays down or stands up he gets dizzy. Pt worried that he will fall at home. States hes unsure if it is some of his medications at home or stress.   Pt was seen his eye doctor over double vision which has resolved.

## 2018-09-16 ENCOUNTER — Emergency Department (HOSPITAL_COMMUNITY)
Admission: EM | Admit: 2018-09-16 | Discharge: 2018-09-16 | Disposition: A | Discharge status: Home / Self Care | Payer: Medicare Other | Attending: Emergency Medicine | Admitting: Emergency Medicine

## 2018-09-16 DIAGNOSIS — R42 Dizziness and giddiness: Secondary | ICD-10-CM

## 2018-09-16 HISTORY — DX: Chronic or unspecified gastric ulcer with hemorrhage: K25.4

## 2018-09-16 MED ORDER — MECLIZINE HCL 12.5 MG PO TABS: 12.5000 mg | ORAL_TABLET | Freq: Three times a day (TID) | ORAL | 0 refills | Status: AC | PRN

## 2018-09-16 NOTE — ED Provider Notes (Signed)
[facility] DEPT Provider Note  CSN: [id number] Arrival date & time: 09/15/18 2305  Chief Complaint(s) Dizziness  HPI Jeffery Alexander is a 85 y.o. male   The history is provided by the patient.  Dizziness  Quality:  Head spinning and room spinning Severity:  Mild Onset quality:  Sudden Duration: 1 month. Timing:  Intermittent Progression:  Waxing and waning (currently not having the dizziness) Chronicity:  New Context comment:  Occurs at while lying down and sitting up Associated symptoms: no blood in stool, no chest pain, no diarrhea, no headaches, no hearing loss, no nausea, no palpitations, no shortness of breath, no tinnitus, no vision changes and no vomiting   Risk factors: no multiple medications and no new medications     Past Medical History Past Medical History:  Diagnosis Date  . Bleeding stomach ulcer    There are no active problems to display for this patient.  Home Medication(s) Prior to Admission medications   Medication Sig Start Date End Date Taking? Authorizing Provider  acetaminophen (TYLENOL) 650 MG CR tablet Take 650-1,300 mg by mouth daily as needed for pain.    [provider]  meclizine (ANTIVERT) 12.5 MG tablet Take 1 tablet (12.5 mg total) by mouth 3 (three) times daily as needed for up to 30 days for dizziness. 09/16/18 10/16/18  Eddison Searls, Grayce Sessions, MD  Naphazoline-Pheniramine (OPCON-A OP) Place 1 drop into both eyes 2 (two) times daily as needed (dry eyes).    [provider]  omeprazole (PRILOSEC) 20 MG capsule Take 20 mg by mouth daily.    [provider]  terazosin (HYTRIN) 5 MG capsule Take 5 mg by mouth at bedtime.    [provider]                                                                                                                                    Past Surgical History Past Surgical History:  Procedure Laterality Date  . APPENDECTOMY    . EUS N/A 03/12/2017   Procedure: UPPER ENDOSCOPIC ULTRASOUND (EUS) RADIAL;  Surgeon: Arta Silence, MD;  Location: [facility];  Service: Endoscopy;  Laterality: N/A;   Family History No family history on file.  Social History Social History   Tobacco Use  . Smoking status: Never Smoker  . Smokeless tobacco: Never Used  Substance Use Topics  . Alcohol use: Not on file  . Drug use: Not on file   Allergies Patient has no known allergies.  Review of Systems Review of Systems  HENT: Negative for hearing loss and tinnitus.   Respiratory: Negative for shortness of breath.   Cardiovascular: Negative for chest pain and palpitations.  Gastrointestinal: Negative for blood in stool, diarrhea, nausea and vomiting.  Neurological: Positive for dizziness. Negative for headaches.   All other systems are reviewed and are negative for acute change except as noted in the HPI  Physical Exam Vital Signs  I have reviewed the triage vital signs BP (!) 153/81 (BP Location: Left Arm)   Pulse 90   Temp 98.2 F (36.8 C)   Resp 18   SpO2 98%   Physical Exam Vitals signs reviewed.  Constitutional:      General: He is not in acute distress.    Appearance: He is well-developed. He is not diaphoretic.  HENT:     Head: Normocephalic and atraumatic.     Nose: Nose normal.  Eyes:     General: No scleral icterus.       Right eye: No discharge.        Left eye: No discharge.     Conjunctiva/sclera: Conjunctivae normal.     Pupils: Pupils are equal, round, and reactive to light.  Neck:     Musculoskeletal: Normal range of motion and neck supple.  Cardiovascular:     Rate and Rhythm: Normal rate and regular rhythm.     Heart sounds: No murmur. No friction rub. No gallop.   Pulmonary:     Effort: Pulmonary effort is normal. No respiratory distress.     Breath sounds: Normal breath sounds. No stridor. No rales.  Abdominal:     General: There is no distension.     Palpations: Abdomen is soft.     Tenderness: There  is no abdominal tenderness.  Musculoskeletal:        General: No tenderness.  Skin:    General: Skin is warm and dry.     Findings: No erythema or rash.  Neurological:     Mental Status: He is alert and oriented to person, place, and time.     Comments: Mental Status:  Alert and oriented to person, place, and time.  Attention and concentration normal.  Speech clear.  Recent memory is intact  Cranial Nerves:  II Visual Fields: Intact to confrontation. Visual fields intact. III, IV, VI: Pupils equal and reactive to light and near. Full eye movement without nystagmus  V Facial Sensation: Normal. No weakness of masticatory muscles  VII: No facial weakness or asymmetry  VIII Auditory Acuity: Grossly normal  IX/X: The uvula is midline; the palate elevates symmetrically  XI: Normal sternocleidomastoid and trapezius strength  XII: The tongue is midline. No atrophy or fasciculations.   Motor System: Muscle Strength: 5/5 and symmetric in the upper and lower extremities. No pronation or drift.  Muscle Tone: Tone and muscle bulk are normal in the upper and lower extremities.   Reflexes: DTRs: 1+ and symmetrical in all four extremities. No Clonus Coordination: Intact finger-to-nose, heel-to-shin. No tremor.  Sensation: Intact to light touch, and pinprick. Negative Romberg test.  Gait: Routine gait normal.  HINTS Plus: Nystagmus:  Unilateral to left Head impulse: abnormal Skew: normal Hearing: intact         ED Results and Treatments Labs (all labs ordered are listed, but only abnormal results are displayed) Labs Reviewed  BASIC METABOLIC PANEL - Abnormal; Notable for the following components:      Result Value   Potassium 3.3 (*)    Glucose, Bld 112 (*)    All other components within normal limits  CBC - Abnormal; Notable for the following components:   RBC 4.02 (*)    All other components within normal limits  URINALYSIS, ROUTINE W REFLEX MICROSCOPIC  EKG  EKG Interpretation  Date/Time:    Ventricular Rate:    PR Interval:    QRS Duration:   QT Interval:    QTC Calculation:   R Axis:     Text Interpretation:        Radiology No results found. Pertinent labs & imaging results that were available during my care of the patient were reviewed by me and considered in my medical decision making (see chart for details).  Medications Ordered in ED Medications - No data to display                                                                                                                                  Procedures Procedures  (including critical care time)  Medical Decision Making / ED Course I have reviewed the nursing notes for this encounter and the patient's prior records (if available in EHR or on provided paperwork).    Patient presents with 1 month of intermittent positional vertiginous symptoms.  Exam is nonfocal. HINTS is reassuring for peripheral etiology.  Labs with mild hypokalemia without other significant electrolyte derangements, renal sufficiency or anemia.  Doubt CVA.  PRN meclizine with close PCP follow up.  The patient appears reasonably screened and/or stabilized for discharge and I doubt any other medical condition or other [facility] requiring further screening, evaluation, or treatment in the ED at this time prior to discharge.  The patient is safe for discharge with strict return precautions.   Final Clinical Impression(s) / ED Diagnoses Final diagnoses:  Vertigo    Disposition: Discharge  Condition: Good  I have discussed the results, Dx and Tx plan with the patient who expressed understanding and agree(s) with the plan. Discharge instructions discussed at great length. The patient was given strict return precautions who verbalized understanding of the instructions. No further questions at time of discharge.     ED Discharge Orders         Ordered    meclizine (ANTIVERT) 12.5 MG tablet  3 times daily PRN     09/16/18 0242           Follow Up: Cari Caraway, MD [place] [address] [phone]  Schedule an appointment as soon as possible for a visit  in 5-7 days, If symptoms do not improve or  worsen     This chart was dictated using voice recognition software.  Despite best efforts to proofread,  errors can occur which can change the documentation meaning.   Fatima Blank, MD 09/16/18 [phone]

## 2018-11-28 ENCOUNTER — Emergency Department (HOSPITAL_COMMUNITY): Payer: Medicare Other

## 2018-11-28 ENCOUNTER — Other Ambulatory Visit: Payer: Self-pay

## 2018-11-28 ENCOUNTER — Emergency Department (HOSPITAL_COMMUNITY)
Admission: EM | Admit: 2018-11-28 | Discharge: 2018-11-28 | Disposition: A | Discharge status: Home / Self Care | Payer: Medicare Other | Attending: Emergency Medicine | Admitting: Emergency Medicine

## 2018-11-28 ENCOUNTER — Encounter (HOSPITAL_COMMUNITY): Payer: Self-pay

## 2018-11-28 DIAGNOSIS — Y999 Unspecified external cause status: Secondary | ICD-10-CM | POA: Diagnosis not present

## 2018-11-28 DIAGNOSIS — Y939 Activity, unspecified: Secondary | ICD-10-CM | POA: Diagnosis not present

## 2018-11-28 DIAGNOSIS — S52592A Other fractures of lower end of left radius, initial encounter for closed fracture: Secondary | ICD-10-CM | POA: Insufficient documentation

## 2018-11-28 DIAGNOSIS — S52615A Nondisplaced fracture of left ulna styloid process, initial encounter for closed fracture: Secondary | ICD-10-CM | POA: Insufficient documentation

## 2018-11-28 DIAGNOSIS — Z79899 Other long term (current) drug therapy: Secondary | ICD-10-CM | POA: Insufficient documentation

## 2018-11-28 DIAGNOSIS — W19XXXA Unspecified fall, initial encounter: Secondary | ICD-10-CM | POA: Diagnosis not present

## 2018-11-28 DIAGNOSIS — S62102A Fracture of unspecified carpal bone, left wrist, initial encounter for closed fracture: Secondary | ICD-10-CM

## 2018-11-28 DIAGNOSIS — Y929 Unspecified place or not applicable: Secondary | ICD-10-CM | POA: Insufficient documentation

## 2018-11-28 DIAGNOSIS — S59912A Unspecified injury of left forearm, initial encounter: Secondary | ICD-10-CM | POA: Diagnosis present

## 2018-11-28 DIAGNOSIS — S52612A Displaced fracture of left ulna styloid process, initial encounter for closed fracture: Secondary | ICD-10-CM | POA: Diagnosis not present

## 2018-11-28 DIAGNOSIS — S52502A Unspecified fracture of the lower end of left radius, initial encounter for closed fracture: Secondary | ICD-10-CM | POA: Diagnosis not present

## 2018-11-28 MED ORDER — OXYCODONE-ACETAMINOPHEN 5-325 MG PO TABS
1.0000 | ORAL_TABLET | Freq: Once | ORAL | Status: AC
  Administered 2018-11-28: 1 via ORAL
  Filled 2018-11-28: qty 1

## 2018-11-28 MED ORDER — HYDROCODONE-ACETAMINOPHEN 5-325 MG PO TABS
1.0000 | ORAL_TABLET | Freq: Four times a day (QID) | ORAL | 0 refills | Status: DC | PRN
Start: 2018-11-28 — End: 2018-12-02

## 2018-11-28 NOTE — Discharge Instructions (Addendum)
Return here at once for numbness or tingling in your left hand, severe pain not controlled with the pain medication prescribed for you, or any other problems.

## 2018-11-28 NOTE — ED Triage Notes (Addendum)
States fell in yard with left wrist pain with deformity noted good circulation and sensation to left hand states lost footing and fell backward no LOC per pt alert and oriented x 3 in triage. No other complaints voiced.

## 2018-11-28 NOTE — ED Provider Notes (Signed)
[facility] DEPT Provider Note   CSN: [id number] Arrival date & time: 11/28/18  1717    History   Chief Complaint Chief Complaint  Patient presents with  . Wrist Pain    HPI Jeffery Alexander is a 85 y.o. male.     85 year old male who presents after mechanical fall just prior to arrival.  States that he fell on outstretched left hand and sustained injury to his wrist.  Now complains of severe sharp pain at his wrist is worse with any movement.  Denies any head or neck injury.  No back pain.  No pain from the waist down.  No new distal numbness or tingling to his left hand.  Symptoms better with remaining still     Past Medical History:  Diagnosis Date  . Bleeding stomach ulcer     There are no active problems to display for this patient.   Past Surgical History:  Procedure Laterality Date  . APPENDECTOMY    . EUS N/A 03/12/2017   Procedure: UPPER ENDOSCOPIC ULTRASOUND (EUS) RADIAL;  Surgeon: Arta Silence, MD;  Location: [facility];  Service: Endoscopy;  Laterality: N/A;        Home Medications    Prior to Admission medications   Medication Sig Start Date End Date Taking? Authorizing Provider  acetaminophen (TYLENOL) 650 MG CR tablet Take 650-1,300 mg by mouth daily as needed for pain.    [provider]  Naphazoline-Pheniramine (OPCON-A OP) Place 1 drop into both eyes 2 (two) times daily as needed (dry eyes).    [provider]  omeprazole (PRILOSEC) 20 MG capsule Take 20 mg by mouth daily.    [provider]  terazosin (HYTRIN) 5 MG capsule Take 5 mg by mouth at bedtime.    [provider]    Family History History reviewed. No pertinent family history.  Social History Social History   Tobacco Use  . Smoking status: Never Smoker  . Smokeless tobacco: Never Used  Substance Use Topics  . Alcohol use: Never    Frequency: Never  . Drug use: Never     Allergies   Patient has no known  allergies.   Review of Systems Review of Systems  All other systems reviewed and are negative.    Physical Exam Updated Vital Signs BP (!) 155/79 (BP Location: Left Arm)   Pulse 97   Temp 98 F (36.7 C) (Oral)   Resp 18   Ht 1.778 m (5\' 10" )   Wt 71.2 kg   SpO2 94%   BMI 22.52 kg/m   Physical Exam Vitals signs and nursing note reviewed.  Constitutional:      General: He is not in acute distress.    Appearance: Normal appearance. He is well-developed. He is not toxic-appearing.  HENT:     Head: Normocephalic and atraumatic.  Eyes:     General: Lids are normal.     Conjunctiva/sclera: Conjunctivae normal.     Pupils: Pupils are equal, round, and reactive to light.  Neck:     Musculoskeletal: Normal range of motion and neck supple.     Thyroid: No thyroid mass.     Trachea: No tracheal deviation.  Cardiovascular:     Rate and Rhythm: Normal rate and regular rhythm.     Heart sounds: Normal heart sounds. No murmur. No gallop.   Pulmonary:     Effort: Pulmonary effort is normal. No respiratory distress.     Breath sounds: Normal breath sounds. No  stridor. No decreased breath sounds, wheezing, rhonchi or rales.  Abdominal:     General: Bowel sounds are normal. There is no distension.     Palpations: Abdomen is soft.     Tenderness: There is no abdominal tenderness. There is no rebound.  Musculoskeletal:     Left wrist: He exhibits decreased range of motion, tenderness and deformity. He exhibits no laceration.     Comments: Patient is neurovascular status is at baseline.  Skin:    General: Skin is warm and dry.     Findings: No abrasion or rash.  Neurological:     Mental Status: He is alert and oriented to person, place, and time.     GCS: GCS eye subscore is 4. GCS verbal subscore is 5. GCS motor subscore is 6.     Cranial Nerves: No cranial nerve deficit.     Sensory: No sensory deficit.  Psychiatric:        Speech: Speech normal.        Behavior: Behavior  normal.      ED Treatments / Results  Labs (all labs ordered are listed, but only abnormal results are displayed) Labs Reviewed - No data to display  EKG None  Radiology No results found.  Procedures Procedures (including critical care time)  Medications Ordered in ED Medications - No data to display   Initial Impression / Assessment and Plan / ED Course  I have reviewed the triage vital signs and the nursing notes.  Pertinent labs & imaging results that were available during my care of the patient were reviewed by me and considered in my medical decision making (see chart for details).        Discussed case with Dr. Apolonio Schneiders, hand surgeon on-call, recommends patient be placed into a splint and he will see the patient in his office in 3 days.  Patient is neurovascular intact at his left hand.  Given Percocet for pain here as well as a prescription for hydrocodone and strict return precautions  Final Clinical Impressions(s) / ED Diagnoses   Final diagnoses:  None    ED Discharge Orders    None       Lacretia Leigh, MD 11/28/18 [phone]

## 2018-12-01 ENCOUNTER — Encounter (HOSPITAL_BASED_OUTPATIENT_CLINIC_OR_DEPARTMENT_OTHER): Payer: Self-pay | Admitting: *Deleted

## 2018-12-01 ENCOUNTER — Other Ambulatory Visit: Payer: Self-pay

## 2018-12-01 DIAGNOSIS — S52502A Unspecified fracture of the lower end of left radius, initial encounter for closed fracture: Secondary | ICD-10-CM | POA: Diagnosis not present

## 2018-12-01 NOTE — Anesthesia Preprocedure Evaluation (Signed)
Anesthesia Evaluation  Patient identified by MRN, date of birth, ID band Patient awake    Reviewed: Allergy & Precautions, NPO status , Patient's Chart, lab work & pertinent test results  Airway Mallampati: II   Neck ROM: Full    Dental no notable dental hx.    Pulmonary neg pulmonary ROS,    breath sounds clear to auscultation       Cardiovascular negative cardio ROS   Rhythm:Regular Rate:Normal     Neuro/Psych negative neurological ROS     GI/Hepatic negative GI ROS, Neg liver ROS,   Endo/Other  negative endocrine ROS  Renal/GU negative Renal ROS     Musculoskeletal   Abdominal   Peds  Hematology negative hematology ROS (+)   Anesthesia Other Findings   Reproductive/Obstetrics                             Anesthesia Physical  Anesthesia Plan  ASA: II  Anesthesia Plan: MAC and Regional   Post-op Pain Management:    Induction:   PONV Risk Score and Plan: 1 and Ondansetron and Dexamethasone  Airway Management Planned: Natural Airway and Nasal Cannula  Additional Equipment:   Intra-op Plan:   Post-operative Plan:   Informed Consent: I have reviewed the patients History and Physical, chart, labs and discussed the procedure including the risks, benefits and alternatives for the proposed anesthesia with the patient or authorized representative who has indicated his/her understanding and acceptance.       Plan Discussed with: CRNA, Anesthesiologist and Surgeon  Anesthesia Plan Comments:         Anesthesia Quick Evaluation

## 2018-12-01 NOTE — H&P (Signed)
  Jeffery Alexander is an 85 y.o. male.   Chief Complaint: LEFT ARM INJURY   HPI: The patient is a 85y/o left hand dominant male who fell on 11/28/18 causing an injury to the left arm. He was seen in the emergency department where he was treated with a sugar tong splint, sling, and pain medicine.  He followed up in our office for further treatment. He has had continued swelling, stiffness, and ecchymosis of the arm. Discussed the reason and rationale for surgery.  He is here today for surgery.  He denies chest pain, shortness of breath, fever, chills, nausea, vomiting, or diarrhea.    Past Medical History:  Diagnosis Date  . Bleeding stomach ulcer   . BPH (benign prostatic hyperplasia)     Past Surgical History:  Procedure Laterality Date  . APPENDECTOMY    . EUS N/A 03/12/2017   Procedure: UPPER ENDOSCOPIC ULTRASOUND (EUS) RADIAL;  Surgeon: Arta Silence, MD;  Location: [facility];  Service: Endoscopy;  Laterality: N/A;    History reviewed. No pertinent family history. Social History:  reports that he has never smoked. He has never used smokeless tobacco. He reports that he does not drink alcohol or use drugs.  Allergies: No Known Allergies  No medications prior to admission.    No results found for this or any previous visit (from the past 48 hour(s)). No results found.  ROS NO RECENT ILLNESSES OR HOSPITALIZATIONS   Height 5\' 10"  (1.778 m), weight 71 kg. Physical Exam  General Appearance:  Alert, cooperative, no distress, appears stated age  Head:  Normocephalic, without obvious abnormality, atraumatic  Eyes:  Pupils equal, conjunctiva/corneas clear,         Throat: Lips, mucosa, and tongue normal; teeth and gums normal  Neck: No visible masses     Lungs:   respirations unlabored  Chest Wall:  No tenderness or deformity  Heart:  Regular rate and rhythm,  Abdomen:   Soft, non-tender,         Extremities: LUE: SKIN INTACT, FINGERS WARM WELL PERFUSED ABLE TO EXTEND  THUMB, LIMITED DIGITAL FLEXION FROM PREVIOUS INJURY TO HAND  Pulses: 2+ and symmetric  Skin: Skin color, texture, turgor normal, no rashes or lesions     Neurologic: Normal    Assessment LEFT DISTAL RADIUS DISPLACED FRACTURE   Plan LEFT DISTAL RADIUS OPEN REDUCTION AND INTERNAL FIXATION WITH REPAIR AS INDICATED   WE ARE PLANNING SURGERY FOR YOUR UPPER EXTREMITY. THE RISKS AND BENEFITS OF SURGERY INCLUDE BUT NOT LIMITED TO BLEEDING INFECTION, DAMAGE TO NEARBY NERVES ARTERIES TENDONS, FAILURE OF SURGERY TO ACCOMPLISH ITS INTENDED GOALS, PERSISTENT SYMPTOMS AND NEED FOR FURTHER SURGICAL INTERVENTION. WITH THIS IN MIND WE WILL PROCEED. I HAVE DISCUSSED WITH THE PATIENT THE PRE AND POSTOPERATIVE REGIMEN AND THE DOS AND DON'TS. PT VOICED UNDERSTANDING AND INFORMED CONSENT SIGNED.  R/B/A DISCUSSED WITH PT IN OFFICE.  PT VOICED UNDERSTANDING OF PLAN CONSENT SIGNED DAY OF SURGERY PT SEEN AND EXAMINED PRIOR TO OPERATIVE PROCEDURE/DAY OF SURGERY SITE MARKED. QUESTIONS ANSWERED WILL GO HOME FOLLOWING SURGERY   Mehul Rudin [facility] MD 12/02/18    Brynda Peon 12/01/2018, 5:24 PM

## 2018-12-02 ENCOUNTER — Other Ambulatory Visit: Payer: Self-pay

## 2018-12-02 ENCOUNTER — Encounter (HOSPITAL_BASED_OUTPATIENT_CLINIC_OR_DEPARTMENT_OTHER): Payer: Self-pay | Admitting: *Deleted

## 2018-12-02 ENCOUNTER — Ambulatory Visit (HOSPITAL_BASED_OUTPATIENT_CLINIC_OR_DEPARTMENT_OTHER)
Admission: RE | Admit: 2018-12-02 | Discharge: 2018-12-02 | Disposition: A | Discharge status: Home / Self Care | Payer: Medicare Other | Attending: Orthopedic Surgery | Admitting: Orthopedic Surgery

## 2018-12-02 ENCOUNTER — Ambulatory Visit (HOSPITAL_BASED_OUTPATIENT_CLINIC_OR_DEPARTMENT_OTHER): Payer: Medicare Other | Admitting: Anesthesiology

## 2018-12-02 ENCOUNTER — Encounter (HOSPITAL_BASED_OUTPATIENT_CLINIC_OR_DEPARTMENT_OTHER)
Admission: RE | Disposition: A | Discharge status: Home / Self Care | Payer: Self-pay | Source: Home / Self Care | Attending: Orthopedic Surgery

## 2018-12-02 DIAGNOSIS — S52502D Unspecified fracture of the lower end of left radius, subsequent encounter for closed fracture with routine healing: Secondary | ICD-10-CM

## 2018-12-02 DIAGNOSIS — G8918 Other acute postprocedural pain: Secondary | ICD-10-CM | POA: Diagnosis not present

## 2018-12-02 DIAGNOSIS — W19XXXA Unspecified fall, initial encounter: Secondary | ICD-10-CM | POA: Diagnosis not present

## 2018-12-02 DIAGNOSIS — S52572A Other intraarticular fracture of lower end of left radius, initial encounter for closed fracture: Secondary | ICD-10-CM | POA: Insufficient documentation

## 2018-12-02 DIAGNOSIS — S52532A Colles' fracture of left radius, initial encounter for closed fracture: Secondary | ICD-10-CM | POA: Diagnosis not present

## 2018-12-02 HISTORY — DX: Benign prostatic hyperplasia without lower urinary tract symptoms: N40.0

## 2018-12-02 HISTORY — PX: OPEN REDUCTION INTERNAL FIXATION (ORIF) DISTAL RADIAL FRACTURE: SHX5989

## 2018-12-02 SURGERY — OPEN REDUCTION INTERNAL FIXATION (ORIF) DISTAL RADIUS FRACTURE
Anesthesia: Monitor Anesthesia Care | Site: Wrist | Laterality: Left

## 2018-12-02 MED ORDER — BUPIVACAINE HCL (PF) 0.25 % IJ SOLN
INTRAMUSCULAR | Status: AC
  Filled 2018-12-02: qty 30

## 2018-12-02 MED ORDER — CEFAZOLIN SODIUM-DEXTROSE 2-4 GM/100ML-% IV SOLN
INTRAVENOUS | Status: AC
  Filled 2018-12-02: qty 100

## 2018-12-02 MED ORDER — MIDAZOLAM HCL 2 MG/2ML IJ SOLN: 1.0000 mg | INTRAMUSCULAR | Status: DC | PRN

## 2018-12-02 MED ORDER — LACTATED RINGERS IV SOLN
INTRAVENOUS | Status: DC
  Administered 2018-12-02: 08:00:00 via INTRAVENOUS

## 2018-12-02 MED ORDER — FENTANYL CITRATE (PF) 100 MCG/2ML IJ SOLN: 25.0000 ug | INTRAMUSCULAR | Status: DC | PRN

## 2018-12-02 MED ORDER — PROPOFOL 10 MG/ML IV BOLUS
INTRAVENOUS | Status: DC | PRN
  Administered 2018-12-02: 100 mg via INTRAVENOUS

## 2018-12-02 MED ORDER — MEPERIDINE HCL 25 MG/ML IJ SOLN: 6.2500 mg | INTRAMUSCULAR | Status: DC | PRN

## 2018-12-02 MED ORDER — ONDANSETRON HCL 4 MG/2ML IJ SOLN
INTRAMUSCULAR | Status: AC
  Filled 2018-12-02: qty 2

## 2018-12-02 MED ORDER — CEFAZOLIN SODIUM-DEXTROSE 2-4 GM/100ML-% IV SOLN: 2.0000 g | INTRAVENOUS | Status: DC

## 2018-12-02 MED ORDER — SCOPOLAMINE 1 MG/3DAYS TD PT72: 1.0000 | MEDICATED_PATCH | Freq: Once | TRANSDERMAL | Status: DC | PRN

## 2018-12-02 MED ORDER — EPHEDRINE SULFATE 50 MG/ML IJ SOLN
INTRAMUSCULAR | Status: DC | PRN
  Administered 2018-12-02: 15 mg via INTRAVENOUS

## 2018-12-02 MED ORDER — OXYCODONE HCL 5 MG/5ML PO SOLN: 5.0000 mg | Freq: Once | ORAL | Status: DC | PRN

## 2018-12-02 MED ORDER — PROPOFOL 500 MG/50ML IV EMUL
INTRAVENOUS | Status: DC | PRN
  Administered 2018-12-02: 75 ug/kg/min via INTRAVENOUS

## 2018-12-02 MED ORDER — ACETAMINOPHEN 325 MG PO TABS: 325.0000 mg | ORAL_TABLET | ORAL | Status: DC | PRN

## 2018-12-02 MED ORDER — OXYCODONE HCL 5 MG PO TABS: 5.0000 mg | ORAL_TABLET | Freq: Once | ORAL | Status: DC | PRN

## 2018-12-02 MED ORDER — LIDOCAINE HCL (PF) 1 % IJ SOLN
INTRAMUSCULAR | Status: AC
  Filled 2018-12-02: qty 30

## 2018-12-02 MED ORDER — BUPIVACAINE-EPINEPHRINE (PF) 0.5% -1:200000 IJ SOLN
INTRAMUSCULAR | Status: DC | PRN
  Administered 2018-12-02: 20 mL via PERINEURAL

## 2018-12-02 MED ORDER — FENTANYL CITRATE (PF) 100 MCG/2ML IJ SOLN
50.0000 ug | INTRAMUSCULAR | Status: DC | PRN
  Administered 2018-12-02 (×2): 50 ug via INTRAVENOUS

## 2018-12-02 MED ORDER — FENTANYL CITRATE (PF) 100 MCG/2ML IJ SOLN
INTRAMUSCULAR | Status: AC
  Filled 2018-12-02: qty 2

## 2018-12-02 MED ORDER — CHLORHEXIDINE GLUCONATE 4 % EX LIQD: 60.0000 mL | Freq: Once | CUTANEOUS | Status: DC

## 2018-12-02 MED ORDER — BUPIVACAINE LIPOSOME 1.3 % IJ SUSP
INTRAMUSCULAR | Status: DC | PRN
Start: 2018-12-02 — End: 2018-12-02
  Administered 2018-12-02: 10 mL via PERINEURAL

## 2018-12-02 MED ORDER — BUPIVACAINE HCL (PF) 0.5 % IJ SOLN
INTRAMUSCULAR | Status: AC
  Filled 2018-12-02: qty 30

## 2018-12-02 MED ORDER — ONDANSETRON HCL 4 MG/2ML IJ SOLN
INTRAMUSCULAR | Status: DC | PRN
  Administered 2018-12-02: 4 mg via INTRAVENOUS

## 2018-12-02 MED ORDER — MIDAZOLAM HCL 2 MG/2ML IJ SOLN
INTRAMUSCULAR | Status: AC
  Filled 2018-12-02: qty 2

## 2018-12-02 MED ORDER — ACETAMINOPHEN 160 MG/5ML PO SOLN: 325.0000 mg | ORAL | Status: DC | PRN

## 2018-12-02 MED ORDER — SODIUM BICARBONATE 4.2 % IV SOLN
INTRAVENOUS | Status: AC
  Filled 2018-12-02: qty 10

## 2018-12-02 MED ORDER — ONDANSETRON HCL 4 MG/2ML IJ SOLN: 4.0000 mg | Freq: Once | INTRAMUSCULAR | Status: DC | PRN

## 2018-12-02 SURGICAL SUPPLY — 74 items
BANDAGE ACE 4X5 VEL STRL LF (GAUZE/BANDAGES/DRESSINGS) ×6 IMPLANT
BIT DRILL 2.2 SS TIBIAL (BIT) ×6 IMPLANT
BLADE SURG 15 STRL LF DISP TIS (BLADE) ×1 IMPLANT
BLADE SURG 15 STRL SS (BLADE) ×2
BNDG CMPR 9X4 STRL LF SNTH (GAUZE/BANDAGES/DRESSINGS) ×1
BNDG ESMARK 4X9 LF (GAUZE/BANDAGES/DRESSINGS) ×3 IMPLANT
BNDG GAUZE ELAST 4 BULKY (GAUZE/BANDAGES/DRESSINGS) ×3 IMPLANT
CANISTER SUCT 1200ML W/VALVE (MISCELLANEOUS) IMPLANT
CORD BIPOLAR FORCEPS 12FT (ELECTRODE) ×3 IMPLANT
COVER BACK TABLE REUSABLE LG (DRAPES) ×3 IMPLANT
COVER WAND RF STERILE (DRAPES) IMPLANT
CUFF TOURN SGL QUICK 18X4 (TOURNIQUET CUFF) ×3 IMPLANT
DECANTER SPIKE VIAL GLASS SM (MISCELLANEOUS) IMPLANT
DRAPE EXTREMITY T 121X128X90 (DISPOSABLE) ×3 IMPLANT
DRAPE OEC MINIVIEW 54X84 (DRAPES) ×3 IMPLANT
DRSG EMULSION OIL 3X3 NADH (GAUZE/BANDAGES/DRESSINGS) ×3 IMPLANT
GAUZE SPONGE 4X4 12PLY STRL (GAUZE/BANDAGES/DRESSINGS) ×3 IMPLANT
GLOVE BIO SURGEON STRL SZ 6.5 (GLOVE) ×2 IMPLANT
GLOVE BIO SURGEONS STRL SZ 6.5 (GLOVE) ×1
GLOVE BIOGEL PI IND STRL 6.5 (GLOVE) ×1 IMPLANT
GLOVE BIOGEL PI IND STRL 7.0 (GLOVE) ×1 IMPLANT
GLOVE BIOGEL PI IND STRL 8.5 (GLOVE) ×1 IMPLANT
GLOVE BIOGEL PI INDICATOR 6.5 (GLOVE) ×2
GLOVE BIOGEL PI INDICATOR 7.0 (GLOVE) ×2
GLOVE BIOGEL PI INDICATOR 8.5 (GLOVE) ×2
GLOVE ECLIPSE 6.5 STRL STRAW (GLOVE) ×3 IMPLANT
GLOVE SURG ORTHO 8.0 STRL STRW (GLOVE) ×3 IMPLANT
GOWN STRL REUS W/ TWL XL LVL3 (GOWN DISPOSABLE) ×1 IMPLANT
GOWN STRL REUS W/TWL XL LVL3 (GOWN DISPOSABLE) ×3
K-WIRE 1.6 (WIRE) ×3
K-WIRE FX5X1.6XNS BN SS (WIRE) ×1
KWIRE FX5X1.6XNS BN SS (WIRE) ×1 IMPLANT
NEEDLE HYPO 25X1 1.5 SAFETY (NEEDLE) IMPLANT
NS IRRIG 1000ML POUR BTL (IV SOLUTION) ×3 IMPLANT
PACK BASIN DAY SURGERY FS (CUSTOM PROCEDURE TRAY) ×3 IMPLANT
PAD CAST 4YDX4 CTTN HI CHSV (CAST SUPPLIES) ×2 IMPLANT
PADDING CAST ABS 4INX4YD NS (CAST SUPPLIES) ×2
PADDING CAST ABS COTTON 4X4 ST (CAST SUPPLIES) ×1 IMPLANT
PADDING CAST COTTON 4X4 STRL (CAST SUPPLIES) ×4
PEG LOCKING SMOOTH 2.2X16 (Screw) ×3 IMPLANT
PEG LOCKING SMOOTH 2.2X18 (Peg) ×6 IMPLANT
PEG LOCKING SMOOTH 2.2X20 (Screw) ×6 IMPLANT
PEG LOCKING SMOOTH 2.2X22 (Screw) ×9 IMPLANT
PEG LOCKING SMOOTH 2.2X24 (Peg) ×3 IMPLANT
PLATE WIDE DVR LEFT (Plate) ×3 IMPLANT
SCREW LOCK 16X2.7X 3 LD TPR (Screw) ×4 IMPLANT
SCREW LOCK 18X2.7X 3 LD TPR (Screw) ×1 IMPLANT
SCREW LOCKING 2.7X16 (Screw) ×8 IMPLANT
SCREW LOCKING 2.7X18 (Screw) ×2 IMPLANT
SLEEVE SCD COMPRESS KNEE MED (MISCELLANEOUS) ×3 IMPLANT
SLING ARM FOAM STRAP LRG (SOFTGOODS) IMPLANT
SLING ARM MED ADULT FOAM STRAP (SOFTGOODS) IMPLANT
SPLINT FIBERGLASS 3X35 (CAST SUPPLIES) ×3 IMPLANT
SPLINT FIBERGLASS 4X30 (CAST SUPPLIES) IMPLANT
STOCKINETTE 4X48 STRL (DRAPES) ×3 IMPLANT
SUCTION FRAZIER HANDLE 10FR (MISCELLANEOUS) ×2
SUCTION TUBE FRAZIER 10FR DISP (MISCELLANEOUS) ×1 IMPLANT
SUT MNCRL AB 3-0 PS2 18 (SUTURE) IMPLANT
SUT MON AB 3-0 SH 27 (SUTURE)
SUT MON AB 3-0 SH27 (SUTURE) IMPLANT
SUT PROLENE 3 0 PS 1 (SUTURE) IMPLANT
SUT PROLENE 4 0 PS 2 18 (SUTURE) ×3 IMPLANT
SUT VIC AB 0 CT1 27 (SUTURE)
SUT VIC AB 0 CT1 27XBRD ANBCTR (SUTURE) IMPLANT
SUT VIC AB 2-0 PS2 27 (SUTURE) ×3 IMPLANT
SUT VIC AB 2-0 SH 27 (SUTURE)
SUT VIC AB 2-0 SH 27XBRD (SUTURE) IMPLANT
SUT VICRYL 4-0 PS2 18IN ABS (SUTURE) ×3 IMPLANT
SYR BULB 3OZ (MISCELLANEOUS) ×3 IMPLANT
SYR CONTROL 10ML LL (SYRINGE) IMPLANT
TOWEL GREEN STERILE FF (TOWEL DISPOSABLE) ×3 IMPLANT
TUBE CONNECTING 20'X1/4 (TUBING) ×1
TUBE CONNECTING 20X1/4 (TUBING) ×2 IMPLANT
UNDERPAD 30X30 (UNDERPADS AND DIAPERS) ×3 IMPLANT

## 2018-12-02 NOTE — Anesthesia Procedure Notes (Addendum)
Anesthesia Regional Block: Supraclavicular block   Pre-Anesthetic Checklist: ,, timeout performed, Correct Patient, Correct Site, Correct Laterality, Correct Procedure, Correct Position, site marked, Risks and benefits discussed,  Surgical consent,  Pre-op evaluation,  At surgeon's request and post-op pain management  Laterality: Right  Prep: chloraprep       Needles:  Injection technique: Single-shot  Needle Type: Echogenic Stimulator Needle     Needle Length: 5cm  Needle Gauge: 22     Additional Needles:   Procedures:, nerve stimulator,,, ultrasound used (permanent image in chart),,,,   Nerve Stimulator or Paresthesia:  Response: hand, 0.45 mA,   Additional Responses:   Narrative:  Start time: 12/02/2018 8:26 AM End time: 12/02/2018 8:30 AM Injection made incrementally with aspirations every 5 mL.  Performed by: Personally  Anesthesiologist: Janeece Riggers, MD  Additional Notes: Functioning IV was confirmed and monitors were applied.  A 95mm 22ga Arrow echogenic stimulator needle was used. Sterile prep and drape,hand hygiene and sterile gloves were used. Ultrasound guidance: relevant anatomy identified, needle position confirmed, local anesthetic spread visualized around nerve(s)., vascular puncture avoided.  Image printed for medical record. Negative aspiration and negative test dose prior to incremental administration of local anesthetic. The patient tolerated the procedure well.

## 2018-12-02 NOTE — Anesthesia Postprocedure Evaluation (Signed)
Anesthesia Post Note  Patient: Jeffery Alexander  Procedure(s) Performed: LEFT DISTAL RADIUS REPAIR / RECONSTRUCTION (Left Wrist)     Patient location during evaluation: PACU Anesthesia Type: Regional and General Level of consciousness: awake and alert Pain management: pain level controlled Vital Signs Assessment: post-procedure vital signs reviewed and stable Respiratory status: spontaneous breathing, nonlabored ventilation, respiratory function stable and patient connected to nasal cannula oxygen Cardiovascular status: blood pressure returned to baseline and stable Postop Assessment: no apparent nausea or vomiting Anesthetic complications: no    Last Vitals:  Vitals:   12/02/18 1115 12/02/18 1130  BP:  (!) 143/68  Pulse: 92 92  Resp: (!) 21   Temp:  36.6 C  SpO2: 92% 96%    Last Pain:  Vitals:   12/02/18 1130  TempSrc:   PainSc: 0-No pain                 Jai Steil

## 2018-12-02 NOTE — Transfer of Care (Signed)
Immediate Anesthesia Transfer of Care Note  Patient: Jeffery Alexander  Procedure(s) Performed: LEFT DISTAL RADIUS REPAIR / RECONSTRUCTION (Left Wrist)  Patient Location: PACU  Anesthesia Type:General and Regional  Level of Consciousness: awake, alert  and oriented  Airway & Oxygen Therapy: Patient Spontanous Breathing and Patient connected to face mask oxygen  Post-op Assessment: Report given to RN and Post -op Vital signs reviewed and stable  Post vital signs: Reviewed and stable  Last Vitals:  Vitals Value Taken Time  BP 137/79 12/02/2018 10:38 AM  Temp    Pulse 94 12/02/2018 10:39 AM  Resp 12 12/02/2018 10:39 AM  SpO2 100 % 12/02/2018 10:39 AM  Vitals shown include unvalidated device data.  Last Pain:  Vitals:   12/02/18 0752  TempSrc: Oral  PainSc: 5       Patients Stated Pain Goal: 3 (68/08/81 1031)  Complications: No apparent anesthesia complications

## 2018-12-02 NOTE — Op Note (Signed)
PREOPERATIVE DIAGNOSIS:Leftwrist intra-articular distal radius fracture 3 more fragments  POSTOPERATIVE DIAGNOSIS:Same  ATTENDING SURGEON:Dr. Iran Planas who scrubbed and present for the entire procedure  ASSISTANT SURGEON:None  ANESTHESIA:Regional block withgeneral  OPERATIVE PROCEDURE: #1: Open treatment ofleftwrist intra-articular distal radius fracture 3 more fragments #2:Leftwrist brachial radialis tendon tenotomy and release #3: Radiographs 3 viewsleftwrist  IMPLANTS:Biomet wide DVR cross lock  RADIOGRAPHIC INTERPRETATION:AP lateral and oblique views of the wrist do show the volar plate fixation in place in good position  SURGICAL INDICATIONS:Patient is a left-hand-dominant male who sustained the closedleftdistal radius fracture. Patient had the intra-articular displacement and angulation is recommended that she undergo the above procedure. Risks of surgery include but not limited to bleeding infection damage nearby nerves arteries or tendons loss of motion of the wrist and digits incomplete relief of symptoms and need for further surgical intervention.  SURGICAL TECHNIQUE:Patient was palpated and found the preoperative holding area to mark the permanent marker made on theleftwrist indicate the correct operative site. Patient brought back to operating placed supine on the anesthesia table where the regionalandgeneral anesthesiawas administered. Preop antibiotics were given. Patient tolerated this well. Well-padded tourniquet was then placed on the leftbrachium and sealedwith the appropriate drape. Leftupper extremity wasthen prepped and draped normal sterile fashion. Preoperative antibiotics were given prior to any skin incision. The leftupper extremity wasthen prepped and draped normal sterile fashion. A timeout was called the correct site was identified the procedure then begun. Attention was then turned to the leftwrist. The limb was  elevated tourniquet insufflated. A longitudinal incision made directly over the FCR. The FCR sheath was then opened proximally and distally. Crossing venous vessels were ligated and cauterized with the bipolar cautery. The FCR sheath was opened proximally distally. The FPL was swept out of the way and the pronator quadratus was opened in an L-shaped fashion. The brachioradialis was then carefully elevated off the radial styloid several procedures in order to regain reduction of the radial column. Tendon tenotomy and release of the brachioradialis was done. Open reduction was then performed. This was a comminuted fracture intra-articular fracture 3 more fragments.The wound was then thoroughly irrigated. The volar plate was then applied was held distally with a K wire position was confirmed using the mini C arm. The oblong screw hole was placed proximally. Distal fixation was then carried out from an ulnar to radial direction with distal locking pegs and one multidirectional screw.Shaft fixation was completed locking and nonlocking screws. Final radiographs were then obtained. The wound was then thoroughly irrigated. The pronator quadratus was closed with 2-0 Vicryl. The subcutaneous tissues closed with 4-0 Vicryl. Skin then closed with a simple Prolene sutures. Adaptic dressing and a sterile compressive bandage then applied patient was then placed in a well-padded sugar tong splint. Patient was extubated and taken recovery in good condition.  POSTOPERATIVE PLAN:Patient be discharged to home. Seehimback in the office 13 days for wound check suture removal x-rays application of a short arm cast. Put in a therapy order for the 4-week mark. Begin outpatient therapy at the 4-week mark. Cast off the 4-week mark. Radiographs at each visit.

## 2018-12-02 NOTE — Discharge Instructions (Signed)
KEEP BANDAGE CLEAN AND DRY CALL OFFICE FOR F/U APPT [phone] in 13 days Dr Caralyn Guile cell [phone] KEEP HAND ELEVATED ABOVE HEART OK TO APPLY ICE TO OPERATIVE AREA CONTACT OFFICE IF ANY WORSENING PAIN OR CONCERNS-.-   Post Anesthesia Home Care Instructions  Activity: Get plenty of rest for the remainder of the day. A responsible individual must stay with you for 24 hours following the procedure.  For the next 24 hours, DO NOT: -Drive a car -Paediatric nurse -Drink alcoholic beverages -Take any medication unless instructed by your physician -Make any legal decisions or sign important papers.  Meals: Start with liquid foods such as gelatin or soup. Progress to regular foods as tolerated. Avoid greasy, spicy, heavy foods. If nausea and/or vomiting occur, drink only clear liquids until the nausea and/or vomiting subsides. Call your physician if vomiting continues.  Special Instructions/Symptoms: Your throat may feel dry or sore from the anesthesia or the breathing tube placed in your throat during surgery. If this causes discomfort, gargle with warm salt water. The discomfort should disappear within 24 hours.    Regional Anesthesia Blocks  1. Numbness or the inability to move the "blocked" extremity may last from 3-48 hours after placement. The length of time depends on the medication injected and your individual response to the medication. If the numbness is not going away after 48 hours, call your surgeon.  2. The extremity that is blocked will need to be protected until the numbness is gone and the  Strength has returned. Because you cannot feel it, you will need to take extra care to avoid injury. Because it may be weak, you may have difficulty moving it or using it. You may not know what position it is in without looking at it while the block is in effect.  3. For blocks in the legs and feet, returning to weight bearing and walking needs to be done carefully. You will need to wait  until the numbness is entirely gone and the strength has returned. You should be able to move your leg and foot normally before you try and bear weight or walk. You will need someone to be with you when you first try to ensure you do not fall and possibly risk injury.  4. Bruising and tenderness at the needle site are common side effects and will resolve in a few days.  5. Persistent numbness or new problems with movement should be communicated to the surgeon or the [place] [phone] [place] [phone]).  Information for Discharge Teaching: EXPAREL (bupivacaine liposome injectable suspension)   Your surgeon or anesthesiologist gave you EXPAREL(bupivacaine) to help control your pain after surgery.   EXPAREL is a local anesthetic that provides pain relief by numbing the tissue around the surgical site.  EXPAREL is designed to release pain medication over time and can control pain for up to 72 hours.  Depending on how you respond to EXPAREL, you may require less pain medication during your recovery.  Possible side effects:  Temporary loss of sensation or ability to move in the area where bupivacaine was injected.  Nausea, vomiting, constipation  Rarely, numbness and tingling in your mouth or lips, lightheadedness, or anxiety may occur.  Call your doctor right away if you think you may be experiencing any of these sensations, or if you have other questions regarding possible side effects.  Follow all other discharge instructions given to you by your surgeon or nurse. Eat a healthy diet and drink  plenty of water or other fluids.  If you return to the hospital for any reason within 96 hours following the administration of EXPAREL, it is important for health care providers to know that you have received this anesthetic. A teal colored band has been placed on your arm with the date, time and amount of EXPAREL you have received in order to alert and  inform your health care providers. Please leave this armband in place for the full 96 hours following administration, and then you may remove the band.

## 2018-12-02 NOTE — Anesthesia Procedure Notes (Signed)
Procedure Name: LMA Insertion Performed by: Bryttani Blew M, CRNA Pre-anesthesia Checklist: Patient identified, Emergency Drugs available, Suction available, Patient being monitored and Timeout performed Patient Re-evaluated:Patient Re-evaluated prior to induction Oxygen Delivery Method: Circle system utilized Preoxygenation: Pre-oxygenation with 100% oxygen Induction Type: IV induction LMA: LMA inserted LMA Size: 4.0 Tube type: Oral Number of attempts: 1 Placement Confirmation: positive ETCO2,  CO2 detector and breath sounds checked- equal and bilateral Tube secured with: Tape Dental Injury: Teeth and Oropharynx as per pre-operative assessment        

## 2018-12-02 NOTE — Progress Notes (Signed)
Assisted Dr. Oddono with left, ultrasound guided, supraclavicular block. Side rails up, monitors on throughout procedure. See vital signs in flow sheet. Tolerated Procedure well. 

## 2018-12-03 NOTE — Addendum Note (Signed)
Addendum  created 12/03/18 1735 by Janeece Riggers, MD   Clinical Note Signed, Intraprocedure Blocks edited

## 2018-12-07 ENCOUNTER — Encounter (HOSPITAL_BASED_OUTPATIENT_CLINIC_OR_DEPARTMENT_OTHER): Payer: Self-pay | Admitting: Orthopedic Surgery

## 2018-12-17 DIAGNOSIS — S52502D Unspecified fracture of the lower end of left radius, subsequent encounter for closed fracture with routine healing: Secondary | ICD-10-CM | POA: Diagnosis not present

## 2018-12-17 DIAGNOSIS — S52502A Unspecified fracture of the lower end of left radius, initial encounter for closed fracture: Secondary | ICD-10-CM | POA: Diagnosis not present

## 2018-12-17 DIAGNOSIS — Z4789 Encounter for other orthopedic aftercare: Secondary | ICD-10-CM | POA: Diagnosis not present

## 2018-12-31 DIAGNOSIS — S52502D Unspecified fracture of the lower end of left radius, subsequent encounter for closed fracture with routine healing: Secondary | ICD-10-CM | POA: Diagnosis not present

## 2018-12-31 DIAGNOSIS — S52502A Unspecified fracture of the lower end of left radius, initial encounter for closed fracture: Secondary | ICD-10-CM | POA: Diagnosis not present

## 2018-12-31 DIAGNOSIS — M25632 Stiffness of left wrist, not elsewhere classified: Secondary | ICD-10-CM | POA: Diagnosis not present

## 2018-12-31 DIAGNOSIS — Z4789 Encounter for other orthopedic aftercare: Secondary | ICD-10-CM | POA: Diagnosis not present

## 2019-02-02 DIAGNOSIS — Z4789 Encounter for other orthopedic aftercare: Secondary | ICD-10-CM | POA: Diagnosis not present

## 2019-02-02 DIAGNOSIS — S52522D Torus fracture of lower end of left radius, subsequent encounter for fracture with routine healing: Secondary | ICD-10-CM | POA: Diagnosis not present

## 2019-03-02 DIAGNOSIS — Z4789 Encounter for other orthopedic aftercare: Secondary | ICD-10-CM | POA: Diagnosis not present

## 2019-03-02 DIAGNOSIS — S52522D Torus fracture of lower end of left radius, subsequent encounter for fracture with routine healing: Secondary | ICD-10-CM | POA: Diagnosis not present

## 2019-03-09 DIAGNOSIS — K2270 Barrett's esophagus without dysplasia: Secondary | ICD-10-CM | POA: Diagnosis not present

## 2019-03-09 DIAGNOSIS — Z79899 Other long term (current) drug therapy: Secondary | ICD-10-CM | POA: Diagnosis not present

## 2019-03-09 DIAGNOSIS — H409 Unspecified glaucoma: Secondary | ICD-10-CM | POA: Diagnosis not present

## 2019-03-09 DIAGNOSIS — N400 Enlarged prostate without lower urinary tract symptoms: Secondary | ICD-10-CM | POA: Diagnosis not present

## 2019-03-09 DIAGNOSIS — Z1389 Encounter for screening for other disorder: Secondary | ICD-10-CM | POA: Diagnosis not present

## 2019-03-09 DIAGNOSIS — Z0000 Encounter for general adult medical examination without abnormal findings: Secondary | ICD-10-CM | POA: Diagnosis not present

## 2019-03-25 DIAGNOSIS — Z23 Encounter for immunization: Secondary | ICD-10-CM | POA: Diagnosis not present

## 2019-05-06 DIAGNOSIS — H401131 Primary open-angle glaucoma, bilateral, mild stage: Secondary | ICD-10-CM | POA: Diagnosis not present

## 2019-05-06 DIAGNOSIS — H35363 Drusen (degenerative) of macula, bilateral: Secondary | ICD-10-CM | POA: Diagnosis not present

## 2019-05-06 DIAGNOSIS — H04123 Dry eye syndrome of bilateral lacrimal glands: Secondary | ICD-10-CM | POA: Diagnosis not present

## 2019-05-06 DIAGNOSIS — Z961 Presence of intraocular lens: Secondary | ICD-10-CM | POA: Diagnosis not present

## 2019-05-06 DIAGNOSIS — H59032 Cystoid macular edema following cataract surgery, left eye: Secondary | ICD-10-CM | POA: Diagnosis not present

## 2019-08-09 DIAGNOSIS — H401131 Primary open-angle glaucoma, bilateral, mild stage: Secondary | ICD-10-CM | POA: Diagnosis not present

## 2019-08-09 DIAGNOSIS — Z961 Presence of intraocular lens: Secondary | ICD-10-CM | POA: Diagnosis not present

## 2019-08-09 DIAGNOSIS — H59032 Cystoid macular edema following cataract surgery, left eye: Secondary | ICD-10-CM | POA: Diagnosis not present

## 2019-08-09 DIAGNOSIS — H04123 Dry eye syndrome of bilateral lacrimal glands: Secondary | ICD-10-CM | POA: Diagnosis not present

## 2019-09-23 ENCOUNTER — Ambulatory Visit: Payer: Medicare Other | Attending: Internal Medicine

## 2019-09-23 DIAGNOSIS — Z23 Encounter for immunization: Secondary | ICD-10-CM | POA: Insufficient documentation

## 2019-09-23 NOTE — Progress Notes (Signed)
   Covid-19 Vaccination Clinic  Name:  Jeffery Alexander    MRN: [id number] DOB: [birth year]  09/23/2019  Mr. Jeffery Alexander was observed post Covid-19 immunization for 15 minutes without incidence. He was provided with Vaccine Information Sheet and instruction to access the V-Safe system.   Mr. Jeffery Alexander was instructed to call 911 with any severe reactions post vaccine: Marland Kitchen Difficulty breathing  . Swelling of your face and throat  . A fast heartbeat  . A bad rash all over your body  . Dizziness and weakness    Immunizations Administered    Name Date Dose VIS Date Route   Pfizer COVID-19 Vaccine 09/23/2019  8:48 AM 0.3 mL 07/09/2019 Intramuscular   Manufacturer: [place]   Lot: [id number]   [place]: [id number]

## 2019-10-19 ENCOUNTER — Ambulatory Visit: Payer: Medicare Other | Attending: Internal Medicine

## 2019-10-19 DIAGNOSIS — Z23 Encounter for immunization: Secondary | ICD-10-CM

## 2019-10-19 NOTE — Progress Notes (Signed)
   Covid-19 Vaccination Clinic  Name:  Jeffery Alexander    MRN: [id number] DOB: 09-16-1926  10/19/2019  [name] was observed post Covid-19 immunization for 15 minutes without incident. He was provided with Vaccine Information Sheet and instruction to access the V-Safe system.   [name] was instructed to call 911 with any severe reactions post vaccine: Marland Kitchen Difficulty breathing  . Swelling of face and throat  . A fast heartbeat  . A bad rash all over body  . Dizziness and weakness   Immunizations Administered    Name Date Dose VIS Date Route   Pfizer COVID-19 Vaccine 10/19/2019  8:38 AM 0.3 mL 07/09/2019 Intramuscular   Manufacturer: [place]   Lot: [id number]   [place]: [id number]

## 2019-11-06 IMAGING — DX LEFT WRIST - COMPLETE 3+ VIEW
4 series · 4 of 4 positions shown · non-contrast
Comparison: None.

CLINICAL DATA: Pain after fall.

EXAM:
LEFT WRIST - COMPLETE 3+ VIEW

[wrist ap (1 of 3)]
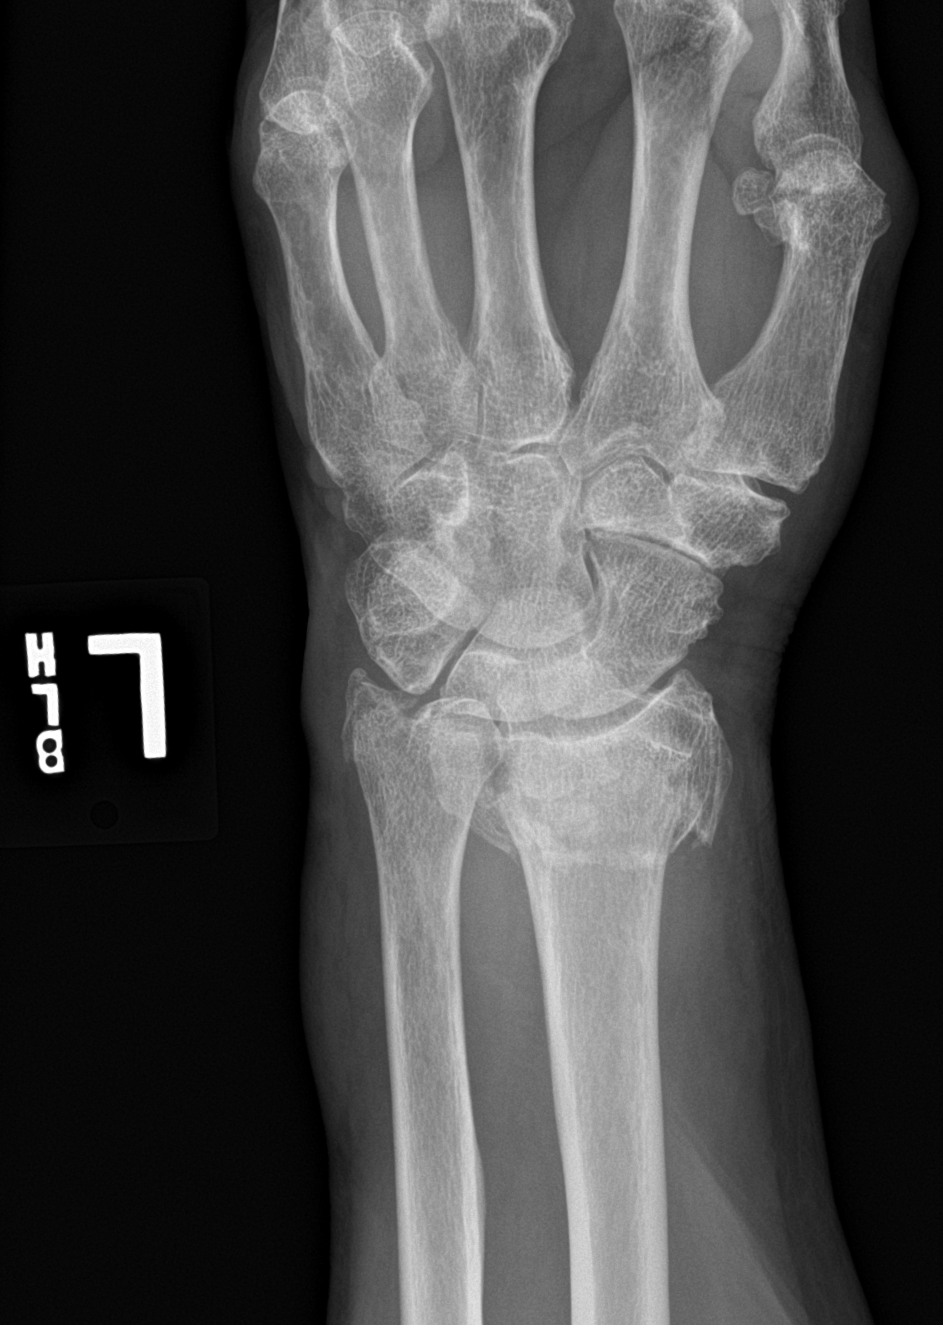

[wrist obl]
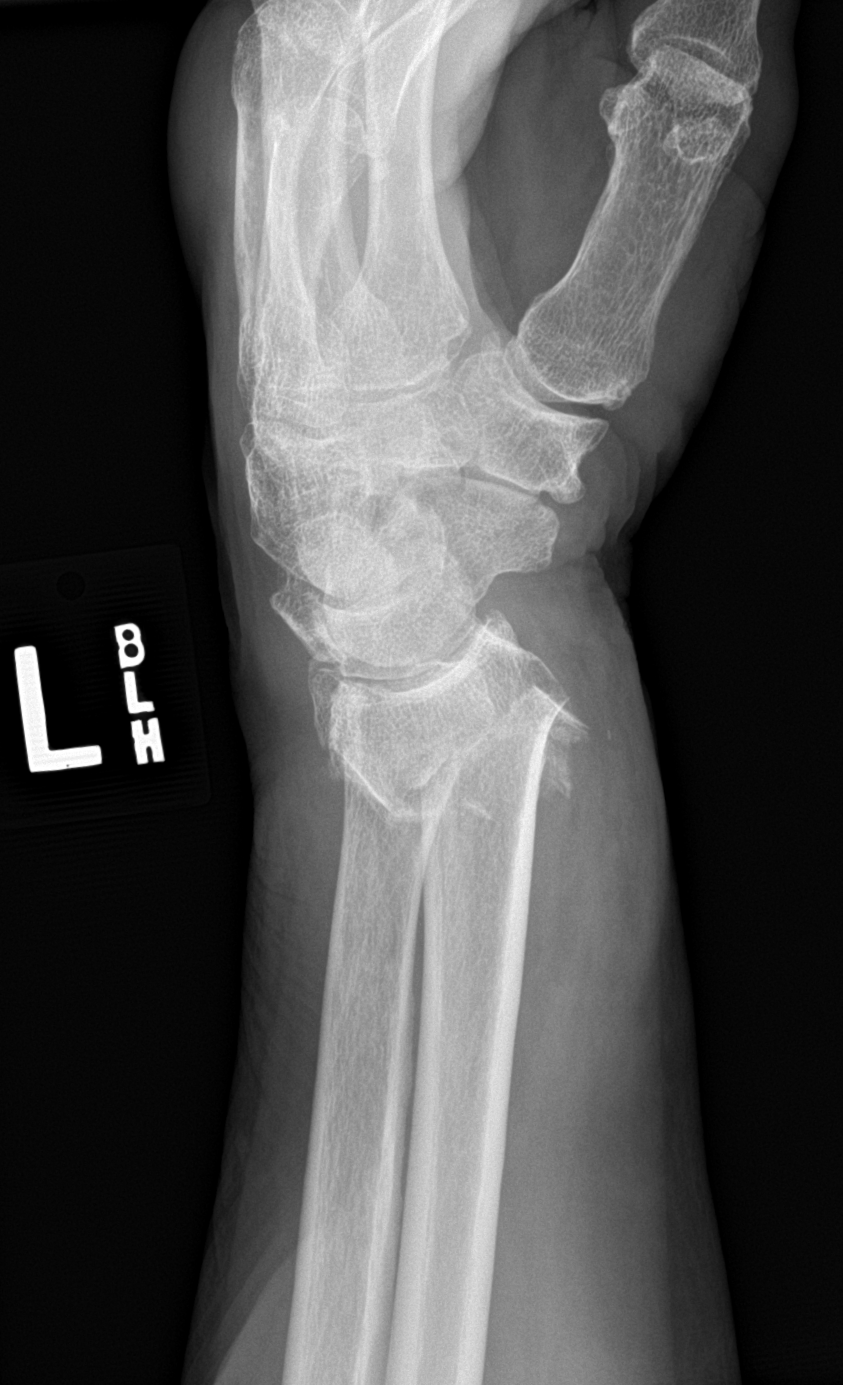

[wrist ap (2 of 3)]
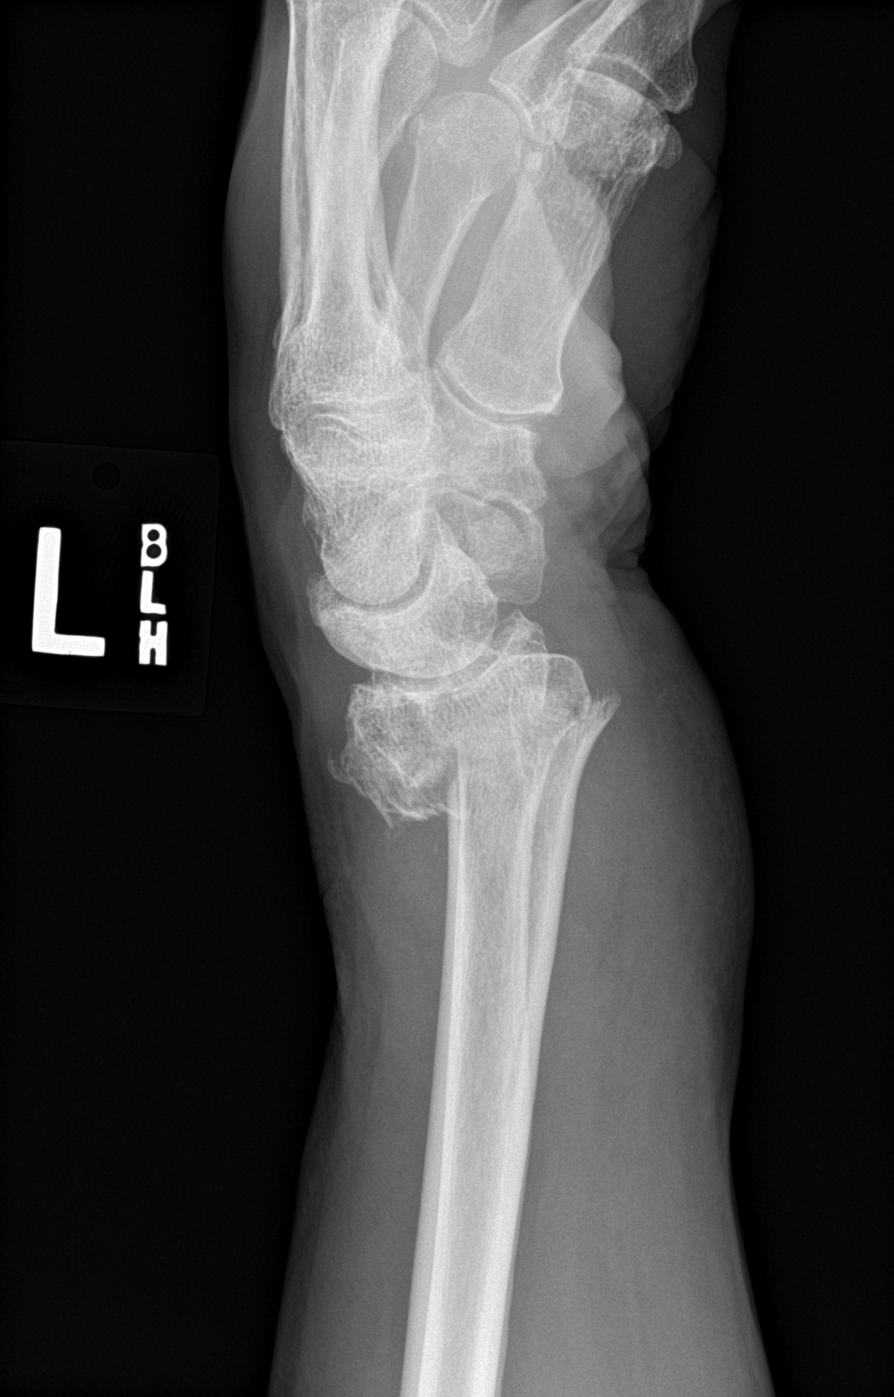

[wrist ap (3 of 3)]
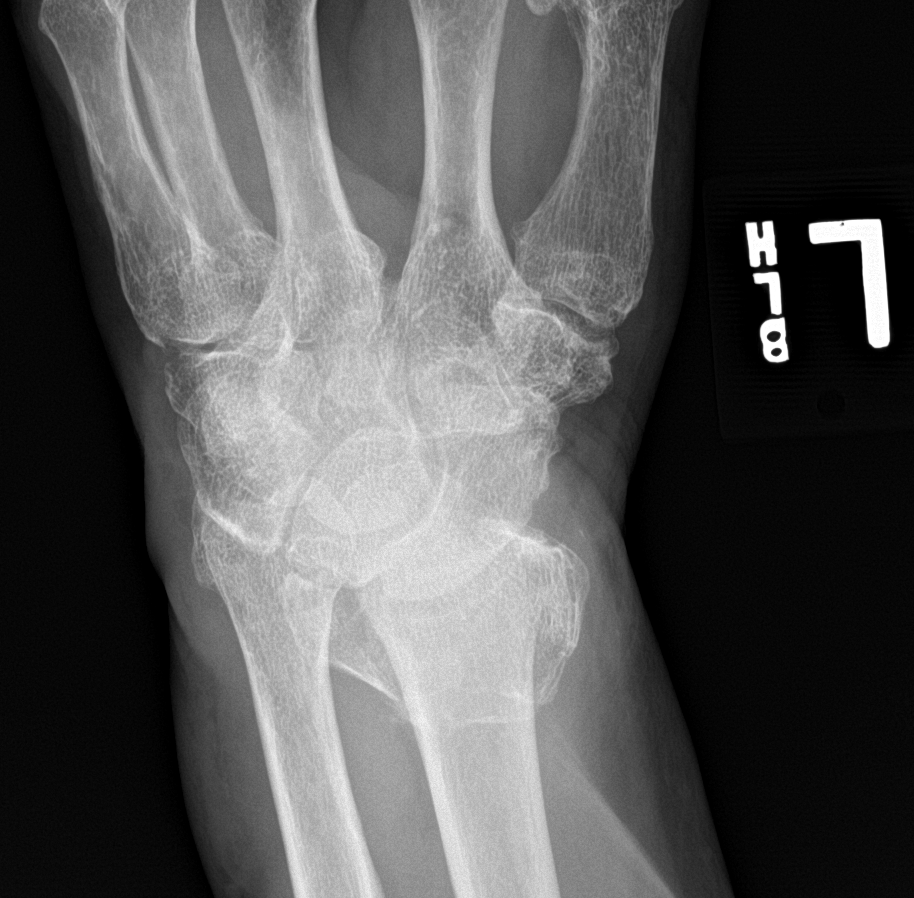

[4 of 4 positions shown; findings below may reference images not displayed]

FINDINGS: There is a fracture through the ulnar styloid. There is a comminuted
angulated/displaced fracture through the distal radial metaphysis.
No dislocation. No other acute abnormalities.
IMPRESSION: Angulated/displaced comminuted fracture of the distal radial
metaphysis. Ulnar styloid fracture.

## 2019-12-07 DIAGNOSIS — H00022 Hordeolum internum right lower eyelid: Secondary | ICD-10-CM | POA: Diagnosis not present

## 2019-12-07 DIAGNOSIS — H401131 Primary open-angle glaucoma, bilateral, mild stage: Secondary | ICD-10-CM | POA: Diagnosis not present

## 2019-12-07 DIAGNOSIS — H59032 Cystoid macular edema following cataract surgery, left eye: Secondary | ICD-10-CM | POA: Diagnosis not present

## 2020-03-13 DIAGNOSIS — K2270 Barrett's esophagus without dysplasia: Secondary | ICD-10-CM | POA: Diagnosis not present

## 2020-03-13 DIAGNOSIS — Z1389 Encounter for screening for other disorder: Secondary | ICD-10-CM | POA: Diagnosis not present

## 2020-03-13 DIAGNOSIS — Z636 Dependent relative needing care at home: Secondary | ICD-10-CM | POA: Diagnosis not present

## 2020-03-13 DIAGNOSIS — Z0000 Encounter for general adult medical examination without abnormal findings: Secondary | ICD-10-CM | POA: Diagnosis not present

## 2020-03-13 DIAGNOSIS — H409 Unspecified glaucoma: Secondary | ICD-10-CM | POA: Diagnosis not present

## 2020-03-13 DIAGNOSIS — Z131 Encounter for screening for diabetes mellitus: Secondary | ICD-10-CM | POA: Diagnosis not present

## 2020-03-13 DIAGNOSIS — N400 Enlarged prostate without lower urinary tract symptoms: Secondary | ICD-10-CM | POA: Diagnosis not present

## 2020-03-13 DIAGNOSIS — D7589 Other specified diseases of blood and blood-forming organs: Secondary | ICD-10-CM | POA: Diagnosis not present

## 2020-04-24 DIAGNOSIS — Z23 Encounter for immunization: Secondary | ICD-10-CM | POA: Diagnosis not present

## 2021-02-14 DIAGNOSIS — Z23 Encounter for immunization: Secondary | ICD-10-CM | POA: Diagnosis not present

## 2021-03-20 DIAGNOSIS — K219 Gastro-esophageal reflux disease without esophagitis: Secondary | ICD-10-CM | POA: Diagnosis not present

## 2021-03-20 DIAGNOSIS — N400 Enlarged prostate without lower urinary tract symptoms: Secondary | ICD-10-CM | POA: Diagnosis not present

## 2021-03-20 DIAGNOSIS — Z0000 Encounter for general adult medical examination without abnormal findings: Secondary | ICD-10-CM | POA: Diagnosis not present

## 2021-03-20 DIAGNOSIS — R634 Abnormal weight loss: Secondary | ICD-10-CM | POA: Diagnosis not present

## 2021-03-20 DIAGNOSIS — K2270 Barrett's esophagus without dysplasia: Secondary | ICD-10-CM | POA: Diagnosis not present

## 2021-05-08 DIAGNOSIS — Z23 Encounter for immunization: Secondary | ICD-10-CM | POA: Diagnosis not present

## 2022-04-02 DIAGNOSIS — Z23 Encounter for immunization: Secondary | ICD-10-CM | POA: Diagnosis not present

## 2022-07-15 ENCOUNTER — Emergency Department (HOSPITAL_COMMUNITY): Payer: Medicare Other

## 2022-07-15 ENCOUNTER — Encounter (HOSPITAL_COMMUNITY): Payer: Self-pay

## 2022-07-15 ENCOUNTER — Other Ambulatory Visit: Payer: Self-pay

## 2022-07-15 ENCOUNTER — Emergency Department (HOSPITAL_COMMUNITY)
Admission: EM | Admit: 2022-07-15 | Discharge: 2022-07-15 | Disposition: A | Discharge status: Home / Self Care | Payer: Medicare Other | Attending: Emergency Medicine | Admitting: Emergency Medicine

## 2022-07-15 DIAGNOSIS — F0390 Unspecified dementia without behavioral disturbance: Secondary | ICD-10-CM | POA: Insufficient documentation

## 2022-07-15 DIAGNOSIS — K5900 Constipation, unspecified: Secondary | ICD-10-CM | POA: Insufficient documentation

## 2022-07-15 DIAGNOSIS — F419 Anxiety disorder, unspecified: Secondary | ICD-10-CM | POA: Insufficient documentation

## 2022-07-15 LAB — BASIC METABOLIC PANEL
Anion gap: 8 (ref 5–15)
BUN: 14 mg/dL (ref 8–23)
CO2: 29 mmol/L (ref 22–32)
Calcium: 9.1 mg/dL (ref 8.9–10.3)
Chloride: 105 mmol/L (ref 98–111)
Creatinine, Ser: 0.95 mg/dL (ref 0.61–1.24)
GFR, Estimated: >60 mL/min (ref >60)
Glucose, Bld: 104 mg/dL — ABNORMAL HIGH (ref 70–99)
Potassium: 3.3 mmol/L — ABNORMAL LOW (ref 3.5–5.1)
Sodium: 142 mmol/L (ref 135–145)

## 2022-07-15 LAB — CBC WITH DIFFERENTIAL/PLATELET
Abs Immature Granulocytes: 0.01 10*3/uL (ref 0.00–0.07)
Basophils Absolute: 0 10*3/uL (ref 0.0–0.1)
Basophils Relative: 1 %
Eosinophils Absolute: 0.1 10*3/uL (ref 0.0–0.5)
Eosinophils Relative: 1 %
HCT: 38.4 % — ABNORMAL LOW (ref 39.0–52.0)
Hemoglobin: 12.9 g/dL — ABNORMAL LOW (ref 13.0–17.0)
Immature Granulocytes: 0 %
Lymphocytes Relative: 35 %
Lymphs Abs: 2 10*3/uL (ref 0.7–4.0)
MCH: 32.6 pg (ref 26.0–34.0)
MCHC: 33.6 g/dL (ref 30.0–36.0)
MCV: 97 fL (ref 80.0–100.0)
Monocytes Absolute: 0.7 10*3/uL (ref 0.1–1.0)
Monocytes Relative: 12 %
Neutro Abs: 2.9 10*3/uL (ref 1.7–7.7)
Neutrophils Relative %: 51 %
Platelets: 241 10*3/uL (ref 150–400)
RBC: 3.96 MIL/uL — ABNORMAL LOW (ref 4.22–5.81)
RDW: 13.3 % (ref 11.5–15.5)
WBC: 5.6 10*3/uL (ref 4.0–10.5)
nRBC: 0 % (ref 0.0–0.2)

## 2022-07-15 NOTE — ED Triage Notes (Addendum)
Patient reports that he has been having anxiety "for quite a while." Patient states his wife is at [facility] and she has been calling him. Patient states he visits his wife quite often and that worries him Patient states they have been married for 70 years.  Patient also reports that he has been having constipation, but took OTC stool softener and has been having BM's  Patient also c/o lower back pain from an old injury in the [employer]. Patient states he has been taking Tylenol Arthritis and it helps some, but he knows when the night comes it will get worse.

## 2022-07-15 NOTE — Discharge Instructions (Addendum)
Please try taking Metamucil daily with water.  Follow up as soon as possible with your primary care doctor about you anxiety. Get help right away if: You have a fever, and your symptoms suddenly get worse. You leak poop or have blood in your poop. Your belly feels hard or bigger than normal (bloated). You have very bad belly pain. You feel dizzy or you faint.

## 2022-07-15 NOTE — ED Provider Triage Note (Signed)
Emergency Medicine Provider Triage Evaluation Note  Jeffery Alexander , a 88 y.o. male  was evaluated in triage.  Pt complains of anxiety and constipation.  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  Since she has been placed in the unit he has been having increased anxiety and difficult "with my nerves."  Patient also reports delayed bowel movements.  He usually takes Colace and Ex-Lax occasionally when he needs to make a bowel movement.  He states that he feels like his bowels have been slower he took a colon cleanser last week which gave him voluminous bowel movements.  Today he only made a small bowel movement.  He denies any abdominal pain nausea vomiting decreased appetite or early satiety.  Review of Systems  Positive: Abdominal discomfort Negative: Vomiting  Physical Exam  BP 139/73 (BP Location: Left Arm)   Pulse 90   Temp 98.6 F (37 C) (Oral)   Resp 16   Ht '5\' 9"'$  (1.753 m)   Wt 64 kg   SpO2 100%   BMI 20.82 kg/m  Gen:   Awake, no distress   Resp:  Normal effort  MSK:   Moves extremities without difficulty  Other:  anxious  Medical Decision Making  Medically screening exam initiated at 3:11 PM.  Appropriate orders placed.  Jeffery Alexander was informed that the remainder of the evaluation will be completed by another provider, this initial triage assessment does not replace that evaluation, and the importance of remaining in the ED until their evaluation is complete.     Jeffery, Marchena, PA-C 07/15/22 1514

## 2022-07-15 NOTE — ED Provider Notes (Signed)
[place] [facility] DEPT Provider Note   CSN: [id number] Arrival date & time: 07/15/22  1333     History  Chief Complaint  Patient presents with   Anxiety   Constipation    Jeffery Alexander is a 88 y.o. male.  Pt complains of anxiety and constipation.  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  Since she has been placed in the unit he has been having increased anxiety and difficult "with my nerves."  Patient also reports delayed bowel movements.  He usually takes Colace and Ex-Lax occasionally when he needs to make a bowel movement.  He states that he feels like his bowels have been slower he took a colon cleanser last week which gave him voluminous bowel movements.  Today he only made a small bowel movement.  He denies any abdominal pain nausea vomiting decreased appetite or early satiety.    Anxiety  Constipation      Home Medications Prior to Admission medications   Medication Sig Start Date End Date Taking? Authorizing Provider  Naphazoline-Pheniramine (OPCON-A OP) Place 1 drop into both eyes 2 (two) times daily as needed (dry eyes).    [provider]  omeprazole (PRILOSEC) 20 MG capsule Take 20 mg by mouth daily.    [provider]  terazosin (HYTRIN) 5 MG capsule Take 5 mg by mouth at bedtime.     [provider]      Allergies    Patient has no known allergies.    Review of Systems   Review of Systems  Gastrointestinal:  Positive for constipation.  Psychiatric/Behavioral:  The patient is nervous/anxious.     Physical Exam Updated Vital Signs BP 139/73 (BP Location: Left Arm)   Pulse 90   Temp 98.6 F (37 C) (Oral)   Resp 16   Ht '5\' 9"'$  (1.753 m)   Wt 64 kg   SpO2 100%   BMI 20.82 kg/m  Physical Exam Vitals and nursing note reviewed.  Constitutional:      General: He is not in acute distress.    Appearance: He is  well-developed. He is not diaphoretic.  HENT:     Head: Normocephalic and atraumatic.     Mouth/Throat:     Mouth: Mucous membranes are moist.  Eyes:     General: No scleral icterus.    Conjunctiva/sclera: Conjunctivae normal.  Cardiovascular:     Rate and Rhythm: Normal rate and regular rhythm.     Heart sounds: Normal heart sounds.  Pulmonary:     Effort: Pulmonary effort is normal. No respiratory distress.     Breath sounds: Normal breath sounds.  Abdominal:     General: There is no distension.     Palpations: Abdomen is soft.     Tenderness: There is no abdominal tenderness. There is no guarding.  Musculoskeletal:     Cervical back: Normal range of motion and neck supple.  Skin:    General: Skin is warm and dry.  Neurological:     Mental Status: He is alert.  Psychiatric:        Behavior: Behavior normal.     ED Results / Procedures / Treatments   Labs (all labs ordered are listed, but only abnormal results are displayed) Labs Reviewed  BASIC METABOLIC PANEL - Abnormal; Notable for the following components:      Result Value   Potassium 3.3 (*)  Glucose, Bld 104 (*)    All other components within normal limits  CBC WITH DIFFERENTIAL/PLATELET - Abnormal; Notable for the following components:   RBC 3.96 (*)    Hemoglobin 12.9 (*)    HCT 38.4 (*)    All other components within normal limits    EKG None  Radiology DG Abdomen 1 View  Result Date: 07/15/2022 CLINICAL DATA:  Constipation EXAM: ABDOMEN - 1 VIEW COMPARISON:  None Available. FINDINGS: No abnormal retained stool burden. Nonobstructive bowel gas pattern. No bowel dilatation or bowel wall thickening. Dextroconvex lumbar scoliosis with multilevel degenerative disc and facet disease changes. Scattered vascular calcifications in pelvis without definite urinary tract calcification. IMPRESSION: No acute abnormalities identified. Degenerative changes and dextroconvex scoliosis lumbar spine. Electronically  Signed   By: Lavonia Dana M.D.   On: 07/15/2022 15:35    Procedures Procedures    Medications Ordered in ED Medications - No data to display  ED Course/ Medical Decision Making/ A&P Clinical Course as of 07/15/22 1851  Mon Jul 15, 2022  1610 Basic metabolic panel(!) [AH]  9604 CBC with Differential(!) [AH]  1847 DG Abdomen 1 View [AH]    Clinical Course User Index [AH] Margarita Mail, PA-C                           Medical Decision Making 63-year-old male here with nervousness and irregular bowel movements.  On examination he has no abdominal tenderness.  He made a bowel movement this morning.  He recently used a laxative and I believe he likely has decreased stool volume to make a large bowel movement.  He has no abdominal pain nausea or vomiting.  I personally reviewed and interpreted the patient's labs which shows no significant abnormalities. 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  Suspect that he will need to speak with his primary care physician about medications regarding his anxiety.  He has no suicidal ideation or severe depression.  He is active in church.  He appears otherwise appropriate for discharge.  Patient seen in shared visit with attending physician. Who agrees with assessment, work up , treatment, and plan for discharge with Metamucil    Amount and/or Complexity of Data Reviewed Labs: ordered. Radiology: ordered.           Final Clinical Impression(s) / ED Diagnoses Final diagnoses:  Anxiety  Constipation, unspecified constipation type    Rx / DC Orders ED Discharge Orders     None         Zeek, R[place]ron, PA-C 07/15/22 1854    Godfrey Pick, MD 07/16/22 [id number]

## 2022-08-01 DIAGNOSIS — Z6821 Body mass index (BMI) 21.0-21.9, adult: Secondary | ICD-10-CM | POA: Diagnosis not present

## 2022-08-01 DIAGNOSIS — F418 Other specified anxiety disorders: Secondary | ICD-10-CM | POA: Diagnosis not present

## 2022-08-01 DIAGNOSIS — R634 Abnormal weight loss: Secondary | ICD-10-CM | POA: Diagnosis not present

## 2022-08-01 DIAGNOSIS — E876 Hypokalemia: Secondary | ICD-10-CM | POA: Diagnosis not present

## 2022-08-07 DIAGNOSIS — K219 Gastro-esophageal reflux disease without esophagitis: Secondary | ICD-10-CM | POA: Diagnosis not present

## 2022-08-07 DIAGNOSIS — K2270 Barrett's esophagus without dysplasia: Secondary | ICD-10-CM | POA: Diagnosis not present

## 2022-08-07 DIAGNOSIS — K5901 Slow transit constipation: Secondary | ICD-10-CM | POA: Diagnosis not present

## 2022-08-28 DIAGNOSIS — R634 Abnormal weight loss: Secondary | ICD-10-CM | POA: Diagnosis not present

## 2022-08-28 DIAGNOSIS — Z6821 Body mass index (BMI) 21.0-21.9, adult: Secondary | ICD-10-CM | POA: Diagnosis not present

## 2022-08-28 DIAGNOSIS — F418 Other specified anxiety disorders: Secondary | ICD-10-CM | POA: Diagnosis not present

## 2022-09-26 DIAGNOSIS — E440 Moderate protein-calorie malnutrition: Secondary | ICD-10-CM | POA: Diagnosis not present

## 2022-09-26 DIAGNOSIS — K5900 Constipation, unspecified: Secondary | ICD-10-CM | POA: Diagnosis not present

## 2022-09-26 DIAGNOSIS — Z6821 Body mass index (BMI) 21.0-21.9, adult: Secondary | ICD-10-CM | POA: Diagnosis not present

## 2022-09-26 DIAGNOSIS — R55 Syncope and collapse: Secondary | ICD-10-CM | POA: Diagnosis not present

## 2022-09-26 DIAGNOSIS — S20219A Contusion of unspecified front wall of thorax, initial encounter: Secondary | ICD-10-CM | POA: Diagnosis not present

## 2022-09-26 DIAGNOSIS — R29898 Other symptoms and signs involving the musculoskeletal system: Secondary | ICD-10-CM | POA: Diagnosis not present

## 2022-09-26 DIAGNOSIS — R1310 Dysphagia, unspecified: Secondary | ICD-10-CM | POA: Diagnosis not present

## 2022-09-26 DIAGNOSIS — W19XXXA Unspecified fall, initial encounter: Secondary | ICD-10-CM | POA: Diagnosis not present

## 2022-09-26 DIAGNOSIS — H409 Unspecified glaucoma: Secondary | ICD-10-CM | POA: Diagnosis not present

## 2022-10-11 ENCOUNTER — Ambulatory Visit: Payer: Medicare Other | Admitting: Physical Therapy

## 2022-10-23 DIAGNOSIS — E440 Moderate protein-calorie malnutrition: Secondary | ICD-10-CM | POA: Diagnosis not present

## 2022-10-23 DIAGNOSIS — K5909 Other constipation: Secondary | ICD-10-CM | POA: Diagnosis not present

## 2022-10-23 DIAGNOSIS — G3184 Mild cognitive impairment, so stated: Secondary | ICD-10-CM | POA: Diagnosis not present

## 2022-10-23 DIAGNOSIS — N400 Enlarged prostate without lower urinary tract symptoms: Secondary | ICD-10-CM | POA: Diagnosis not present

## 2022-10-23 DIAGNOSIS — Z6821 Body mass index (BMI) 21.0-21.9, adult: Secondary | ICD-10-CM | POA: Diagnosis not present

## 2022-12-19 DIAGNOSIS — Z6821 Body mass index (BMI) 21.0-21.9, adult: Secondary | ICD-10-CM | POA: Diagnosis not present

## 2022-12-19 DIAGNOSIS — G3184 Mild cognitive impairment, so stated: Secondary | ICD-10-CM | POA: Diagnosis not present

## 2023-02-17 DIAGNOSIS — E440 Moderate protein-calorie malnutrition: Secondary | ICD-10-CM | POA: Diagnosis not present

## 2023-02-17 DIAGNOSIS — Z6821 Body mass index (BMI) 21.0-21.9, adult: Secondary | ICD-10-CM | POA: Diagnosis not present

## 2023-02-17 DIAGNOSIS — G3184 Mild cognitive impairment, so stated: Secondary | ICD-10-CM | POA: Diagnosis not present

## 2023-02-17 DIAGNOSIS — K219 Gastro-esophageal reflux disease without esophagitis: Secondary | ICD-10-CM | POA: Diagnosis not present

## 2023-02-17 DIAGNOSIS — K2270 Barrett's esophagus without dysplasia: Secondary | ICD-10-CM | POA: Diagnosis not present
